# Patient Record
Sex: Female | Born: 1970 | Race: White | Hispanic: Yes | Marital: Married | Smoking: Never smoker
Health system: Southern US, Community
[De-identification: ages and names within clinical notes are randomized; demographics above are authoritative.]

## PROBLEM LIST (undated history)

## (undated) DIAGNOSIS — K759 Inflammatory liver disease, unspecified: Secondary | ICD-10-CM

## (undated) HISTORY — DX: Inflammatory liver disease, unspecified: K75.9

## (undated) HISTORY — PX: OTHER SURGICAL HISTORY: SHX169

## (undated) HISTORY — PX: BREAST SURGERY: SHX581

---

## 2017-03-08 ENCOUNTER — Encounter (HOSPITAL_BASED_OUTPATIENT_CLINIC_OR_DEPARTMENT_OTHER): Payer: Self-pay | Admitting: *Deleted

## 2017-03-08 ENCOUNTER — Emergency Department (HOSPITAL_BASED_OUTPATIENT_CLINIC_OR_DEPARTMENT_OTHER): Payer: 59

## 2017-03-08 ENCOUNTER — Emergency Department (HOSPITAL_BASED_OUTPATIENT_CLINIC_OR_DEPARTMENT_OTHER)
Admission: EM | Admit: 2017-03-08 | Discharge: 2017-03-08 | Disposition: A | Discharge status: Home / Self Care | Payer: 59 | Attending: Emergency Medicine | Admitting: Emergency Medicine

## 2017-03-08 ENCOUNTER — Other Ambulatory Visit: Payer: Self-pay

## 2017-03-08 DIAGNOSIS — K115 Sialolithiasis: Secondary | ICD-10-CM | POA: Diagnosis not present

## 2017-03-08 DIAGNOSIS — R221 Localized swelling, mass and lump, neck: Secondary | ICD-10-CM | POA: Diagnosis present

## 2017-03-08 LAB — CBC WITH DIFFERENTIAL/PLATELET
Basophils Absolute: 0 10*3/uL (ref 0.0–0.1)
Basophils Relative: 0 %
Eosinophils Absolute: 0 10*3/uL (ref 0.0–0.7)
Eosinophils Relative: 0 %
HCT: 38.4 % (ref 36.0–46.0)
Hemoglobin: 13 g/dL (ref 12.0–15.0)
Lymphocytes Relative: 39 %
Lymphs Abs: 1.9 10*3/uL (ref 0.7–4.0)
MCH: 29.9 pg (ref 26.0–34.0)
MCHC: 33.9 g/dL (ref 30.0–36.0)
MCV: 88.3 fL (ref 78.0–100.0)
Monocytes Absolute: 0.5 10*3/uL (ref 0.1–1.0)
Monocytes Relative: 11 %
Neutro Abs: 2.4 10*3/uL (ref 1.7–7.7)
Neutrophils Relative %: 50 %
Platelets: 227 10*3/uL (ref 150–400)
RBC: 4.35 MIL/uL (ref 3.87–5.11)
RDW: 12.5 % (ref 11.5–15.5)
WBC: 4.9 10*3/uL (ref 4.0–10.5)

## 2017-03-08 LAB — BASIC METABOLIC PANEL
Anion gap: 8 (ref 5–15)
BUN: 9 mg/dL (ref 6–20)
CO2: 28 mmol/L (ref 22–32)
Calcium: 8.5 mg/dL — ABNORMAL LOW (ref 8.9–10.3)
Chloride: 99 mmol/L — ABNORMAL LOW (ref 101–111)
Creatinine, Ser: 0.67 mg/dL (ref 0.44–1.00)
GFR calc Af Amer: >60 mL/min (ref >60)
GFR calc non Af Amer: >60 mL/min (ref >60)
Glucose, Bld: 94 mg/dL (ref 65–99)
Potassium: 3.3 mmol/L — ABNORMAL LOW (ref 3.5–5.1)
Sodium: 135 mmol/L (ref 135–145)

## 2017-03-08 MED ORDER — IOPAMIDOL (ISOVUE-300) INJECTION 61%
100.0000 mL | Freq: Once | INTRAVENOUS | Status: AC | PRN
  Administered 2017-03-08: 75 mL via INTRAVENOUS

## 2017-03-08 MED ORDER — AMOXICILLIN-POT CLAVULANATE 875-125 MG PO TABS: 1.0000 | ORAL_TABLET | Freq: Two times a day (BID) | ORAL | 0 refills | Status: DC

## 2017-03-08 NOTE — Discharge Instructions (Signed)
Begin eating sour foods such as lemons, oranges.  Eat lemon hard candies called Lemon Heads.  You can find these at the grocery store or drugstore in the candy section.  Please follow-up with the ear nose and throat doctor by calling on Monday morning.  If your neck becomes red, swollen, and warm, or you develop a fever again, begin taking the Augmentin prescription.  Do not begin taking this unless these things happen.

## 2017-03-08 NOTE — ED Triage Notes (Signed)
Cough and fever since 03-04-17. Urgent care gave her prednisone, benzonatate. She hasn't had any relief. Ibubrofen did relieve fever. Pt feels like swelling & itching in throat and moving to ear. Urgent care told her she needs a CT scan.

## 2017-03-08 NOTE — ED Provider Notes (Signed)
[facility] EMERGENCY DEPARTMENT Provider Note   CSN: [id number] Arrival date & time: 03/08/17  1228     History   Chief Complaint Chief Complaint  Patient presents with  . Cough  . Fever    HPI Cheryl Nichols is a 47 y.o. female who is previously healthy, originally from [place] and vaccination status is unknown, who presents with a left-sided neck mass.  Patient reports for the past 5 days she had a fever and cough.  She was seen in urgent care and prescribed Tessalon and prednisone.  She reports her fever and cough have resolved, however she developed a neck mass that has progressively grown overnight.  It is tender.  It hurts with swallowing.  Of note, she did have a root canal in November 2018, however has not had any significant problems with her teeth.  She has not taken any prednisone and Tessalon today.  HPI  History reviewed. No pertinent past medical history.  There are no active problems to display for this patient.   Past Surgical History:  Procedure Laterality Date  . BREAST SURGERY    . ceasarean      OB History    No data available       Home Medications    Prior to Admission medications   Medication Sig Start Date End Date Taking? Authorizing Provider  benzonatate (TESSALON) 100 MG capsule Take by mouth 3 (three) times daily.   Yes [provider]  predniSONE (DELTASONE) 20 MG tablet Take 20 mg by mouth daily with breakfast.   Yes [provider]  amoxicillin-clavulanate (AUGMENTIN) 875-125 MG tablet Take 1 tablet by mouth every 12 (twelve) hours. 03/08/17   Emi Holes, PA-C    Family History History reviewed. No pertinent family history.  Social History Social History   Tobacco Use  . Smoking status: Never Smoker  . Smokeless tobacco: Never Used  Substance Use Topics  . Alcohol use: No    Frequency: Never  . Drug use: No     Allergies   Patient has no known allergies.   Review of Systems Review of  Systems  Constitutional: Negative for chills and fever (resolved).  HENT: Negative for facial swelling and sore throat.   Respiratory: Positive for cough (resolved). Negative for shortness of breath.   Cardiovascular: Negative for chest pain.  Gastrointestinal: Negative for abdominal pain, nausea and vomiting.  Genitourinary: Negative for dysuria.  Musculoskeletal: Positive for neck pain (anterior, L sided mass, tender). Negative for back pain.  Skin: Negative for rash and wound.  Neurological: Negative for headaches.  Psychiatric/Behavioral: The patient is not nervous/anxious.      Physical Exam Updated Vital Signs BP 124/61 (BP Location: Left Arm)   Pulse 76   Temp 98.4 F (36.9 C) (Oral)   Resp 16   Ht 5' 6.54" (1.69 m)   Wt 68 kg (149 lb 14.6 oz)   LMP 02/12/2017 (Approximate)   SpO2 100%   BMI 23.81 kg/m   Physical Exam  Constitutional: She appears well-developed and well-nourished. No distress.  HENT:  Head: Normocephalic and atraumatic.  Mouth/Throat: Oropharynx is clear and moist. No oropharyngeal exudate.  Eyes: Conjunctivae are normal. Pupils are equal, round, and reactive to light. Right eye exhibits no discharge. Left eye exhibits no discharge. No scleral icterus.  Neck: Normal range of motion. Neck supple. No thyromegaly present.    Cardiovascular: Normal rate, regular rhythm, normal heart sounds and intact distal pulses. Exam reveals no gallop and  no friction rub.  No murmur heard. Pulmonary/Chest: Effort normal and breath sounds normal. No stridor. No respiratory distress. She has no wheezes. She has no rales.  Abdominal: Soft. Bowel sounds are normal. She exhibits no distension. There is no tenderness. There is no rebound and no guarding.  Musculoskeletal: She exhibits no edema.  Lymphadenopathy:    She has no cervical adenopathy.  Neurological: She is alert. Coordination normal.  Skin: Skin is warm and dry. No rash noted. She is not diaphoretic. No  pallor.  Psychiatric: She has a normal mood and affect.  Nursing note and vitals reviewed.    ED Treatments / Results  Labs (all labs ordered are listed, but only abnormal results are displayed) Labs Reviewed  BASIC METABOLIC PANEL - Abnormal; Notable for the following components:      Result Value   Potassium 3.3 (*)    Chloride 99 (*)    Calcium 8.5 (*)    All other components within normal limits  CBC WITH DIFFERENTIAL/PLATELET    EKG  EKG Interpretation None       Radiology Ct Soft Tissue Neck W Contrast  Result Date: 03/08/2017 CLINICAL DATA:  Left neck mass.  Recent fever. EXAM: CT NECK WITH CONTRAST TECHNIQUE: Multidetector CT imaging of the neck was performed using the standard protocol following the bolus administration of intravenous contrast. CONTRAST:  75mL ISOVUE-300 IOPAMIDOL (ISOVUE-300) INJECTION 61% COMPARISON:  None. FINDINGS: Pharynx and larynx: Symmetric pharyngeal soft tissues without mass. Left parapharyngeal space inflammation and fluid which is contiguous with submandibular inflammation described below. No retropharyngeal fluid. Unremarkable larynx. Salivary glands: Asymmetric enlargement of the left submandibular gland with associated edema and surrounding submandibular space inflammatory stranding and nonloculated fluid. Stranding extends into the anterior left upper neck and medially into the parapharyngeal space, and no abscess is identified. There is a punctate calcification in the far anterior aspect of the left submandibular gland, and there is also a punctate calcification medial to the gland which may be associated with a mildly dilated proximal duct (although it is somewhat more posteriorly positioned than is typical for a ductal stone and may be posterior to the duct and unrelated). There is no evidence of ductal dilatation. The right submandibular gland and both parotid glands are unremarkable. Thyroid: Unremarkable. Lymph nodes: A 12 mm left level III  lymph node and small left level II lymph nodes measuring up to 8 mm in short axis are likely reactive. Vascular: Major vascular structures of the neck are patent. Limited intracranial: Incidental mild cerebellar tonsillar ectopia estimated at 5 mm. Visualized orbits: Unremarkable. Mastoids and visualized paranasal sinuses: Mild right maxillary sinus mucosal thickening. No sinus fluid. Clear mastoid air cells. Skeleton: No acute osseous abnormality or suspicious osseous lesion. Upper chest: Minimal scarring in the lung apices. Other: None. IMPRESSION: Left submandibular gland sialadenitis with 1 or 2 associated punctate sialoliths. Electronically Signed   By: Sebastian AcheAllen  Grady M.D.   On: 03/08/2017 17:46    Procedures Procedures (including critical care time)  Medications Ordered in ED Medications  iopamidol (ISOVUE-300) 61 % injection 100 mL (75 mLs Intravenous Contrast Given 03/08/17 1705)     Initial Impression / Assessment and Plan / ED Course  I have reviewed the triage vital signs and the nursing notes.  Pertinent labs & imaging results that were available during my care of the patient were reviewed by me and considered in my medical decision making (see chart for details).     Patient with left submandibular gland  sialadenitis with 1 or 2 associated punctate sialoliths found on CT neck.  No sign of infection at this time.  Patient advised to start eating sour hard candies and sour foods.  Patient given Augmentin prescription and told only to fill if the area becomes red, warm, or she develops a fever again.  Follow-up to ENT.  Labs are unremarkable.  Return precautions discussed.  Patient understands and agrees with plan.  Patient vitals stable throughout ED course and discharged in satisfactory condition.  Final Clinical Impressions(s) / ED Diagnoses   Final diagnoses:  Salivary gland calculus    ED Discharge Orders        Ordered    amoxicillin-clavulanate (AUGMENTIN) 875-125 MG tablet   Every 12 hours     03/08/17 1811       Emi Holes, PA-C 03/09/17 1556    Tilden Fossa, MD 03/10/17 [phone]

## 2018-02-10 ENCOUNTER — Other Ambulatory Visit: Payer: Self-pay

## 2018-02-10 ENCOUNTER — Encounter: Payer: Self-pay | Admitting: Obstetrics and Gynecology

## 2018-02-10 ENCOUNTER — Ambulatory Visit (INDEPENDENT_AMBULATORY_CARE_PROVIDER_SITE_OTHER): Payer: 59 | Admitting: Obstetrics and Gynecology

## 2018-02-10 VITALS — BP 126/79 | HR 88 | Temp 97.7°F | Ht 65.0 in | Wt 154.5 lb

## 2018-02-10 DIAGNOSIS — Z1151 Encounter for screening for human papillomavirus (HPV): Secondary | ICD-10-CM | POA: Diagnosis not present

## 2018-02-10 DIAGNOSIS — Z01419 Encounter for gynecological examination (general) (routine) without abnormal findings: Secondary | ICD-10-CM | POA: Diagnosis not present

## 2018-02-10 DIAGNOSIS — Z124 Encounter for screening for malignant neoplasm of cervix: Secondary | ICD-10-CM | POA: Diagnosis not present

## 2018-02-10 NOTE — Progress Notes (Signed)
New GYN, presents for AEX/PAP.   Last PAP 01/2015;  Last Mammo 01/2015.  C/o pain 4/10 in left breast radiating to arm mostly before period.  LMP 01/25/18.

## 2018-02-10 NOTE — Progress Notes (Signed)
Subjective:     Cheryl Nichols is a 47 y.o. female P2 with BMI 25 and LMP 01/26/2018 who is here for a comprehensive physical exam. The patient reports no problems. She reports a monthly 6-day period. She is sexually active using natural family planning for contraception. Patient recently moved from [place] with mammogram records. Patient denies pelvic pain or abnormal discharge  No past medical history on file. Past Surgical History:  Procedure Laterality Date  . BREAST SURGERY    . ceasarean    . CESAREAN SECTION     History reviewed. No pertinent family history.  Social History   Socioeconomic History  . Marital status: Married    Spouse name: Not on file  . Number of children: Not on file  . Years of education: Not on file  . Highest education level: Not on file  Occupational History  . Not on file  Social Needs  . Financial resource strain: Not on file  . Food insecurity:    Worry: Not on file    Inability: Not on file  . Transportation needs:    Medical: Not on file    Non-medical: Not on file  Tobacco Use  . Smoking status: Never Smoker  . Smokeless tobacco: Never Used  Substance and Sexual Activity  . Alcohol use: No    Frequency: Never  . Drug use: No  . Sexual activity: Not on file  Lifestyle  . Physical activity:    Days per week: Not on file    Minutes per session: Not on file  . Stress: Not on file  Relationships  . Social connections:    Talks on phone: Not on file    Gets together: Not on file    Attends religious service: Not on file    Active member of club or organization: Not on file    Attends meetings of clubs or organizations: Not on file    Relationship status: Not on file  . Intimate partner violence:    Fear of current or ex partner: Not on file    Emotionally abused: Not on file    Physically abused: Not on file    Forced sexual activity: Not on file  Other Topics Concern  . Not on file  Social History Narrative  . Not on file    Health Maintenance  Topic Date Due  . PAP SMEAR  April 16, 1972  . HIV Screening  06/09/1987  . MAMMOGRAM  06/09/1990  . TETANUS/TDAP  06/09/1991  . INFLUENZA VACCINE  10/02/2017    Review of Systems Pertinent items are noted in HPI.   Objective:  Blood pressure 126/79, pulse 88, temperature 97.7 F (36.5 C), height 5\' 5"  (1.651 m), weight 154 lb 8 oz (70.1 kg), last menstrual period 01/26/2018.     GENERAL: Well-developed, well-nourished female in no acute distress.  HEENT: Normocephalic, atraumatic. Sclerae anicteric.  NECK: Supple. Normal thyroid.  LUNGS: Clear to auscultation bilaterally.  HEART: Regular rate and rhythm. BREASTS: Symmetric in size. No palpable masses or lymphadenopathy, skin changes, or nipple drainage. ABDOMEN: Soft, nontender, nondistended. No organomegaly. PELVIC: Normal external female genitalia. Vagina is pink and rugated.  Normal discharge. Normal appearing cervix. Uterus is normal in size. No adnexal mass or tenderness. EXTREMITIES: No cyanosis, clubbing, or edema, 2+ distal pulses.    Assessment:    Healthy female exam.      Plan:    pap smear collected Fasting labs ordered Screening mammogram ordered Patient will be contacted with abnormal  results See After Visit Summary for Counseling Recommendations

## 2018-02-11 LAB — CBC
Hematocrit: 37.4 % (ref 34.0–46.6)
Hemoglobin: 13.2 g/dL (ref 11.1–15.9)
MCH: 30.3 pg (ref 26.6–33.0)
MCHC: 35.3 g/dL (ref 31.5–35.7)
MCV: 86 fL (ref 79–97)
PLATELETS: 319 10*3/uL (ref 150–450)
RBC: 4.35 x10E6/uL (ref 3.77–5.28)
RDW: 12.4 % (ref 12.3–15.4)
WBC: 6.5 10*3/uL (ref 3.4–10.8)

## 2018-02-11 LAB — COMPREHENSIVE METABOLIC PANEL
ALBUMIN: 4.6 g/dL (ref 3.5–5.5)
ALT: 16 IU/L (ref 0–32)
AST: 16 IU/L (ref 0–40)
Albumin/Globulin Ratio: 1.6 (ref 1.2–2.2)
Alkaline Phosphatase: 102 IU/L (ref 39–117)
BUN / CREAT RATIO: 18 (ref 9–23)
BUN: 12 mg/dL (ref 6–24)
Bilirubin Total: 0.4 mg/dL (ref 0.0–1.2)
CHLORIDE: 102 mmol/L (ref 96–106)
CO2: 22 mmol/L (ref 20–29)
Calcium: 9.6 mg/dL (ref 8.7–10.2)
Creatinine, Ser: 0.68 mg/dL (ref 0.57–1.00)
GFR calc non Af Amer: 106 mL/min/{1.73_m2} (ref >59)
GFR, EST AFRICAN AMERICAN: 122 mL/min/{1.73_m2} (ref >59)
GLOBULIN, TOTAL: 2.8 g/dL (ref 1.5–4.5)
GLUCOSE: 92 mg/dL (ref 65–99)
Potassium: 4.2 mmol/L (ref 3.5–5.2)
SODIUM: 136 mmol/L (ref 134–144)
TOTAL PROTEIN: 7.4 g/dL (ref 6.0–8.5)

## 2018-02-11 LAB — LIPID PANEL
CHOL/HDL RATIO: 2 ratio (ref 0.0–4.4)
Cholesterol, Total: 164 mg/dL (ref 100–199)
HDL: 84 mg/dL (ref >39)
LDL CALC: 68 mg/dL (ref 0–99)
Triglycerides: 60 mg/dL (ref 0–149)
VLDL Cholesterol Cal: 12 mg/dL (ref 5–40)

## 2018-02-11 LAB — TSH: TSH: 1.28 u[IU]/mL (ref 0.450–4.500)

## 2018-02-11 LAB — HEMOGLOBIN A1C
ESTIMATED AVERAGE GLUCOSE: 105 mg/dL
HEMOGLOBIN A1C: 5.3 % (ref 4.8–5.6)

## 2018-02-13 LAB — CYTOLOGY - PAP
Diagnosis: NEGATIVE
HPV: NOT DETECTED

## 2018-03-24 ENCOUNTER — Ambulatory Visit
Admission: RE | Admit: 2018-03-24 | Discharge: 2018-03-24 | Disposition: A | Discharge status: Home / Self Care | Payer: 59 | Source: Ambulatory Visit | Attending: Obstetrics and Gynecology | Admitting: Obstetrics and Gynecology

## 2018-03-24 DIAGNOSIS — Z01419 Encounter for gynecological examination (general) (routine) without abnormal findings: Secondary | ICD-10-CM

## 2018-12-25 ENCOUNTER — Other Ambulatory Visit: Payer: Self-pay

## 2018-12-25 ENCOUNTER — Ambulatory Visit (INDEPENDENT_AMBULATORY_CARE_PROVIDER_SITE_OTHER): Payer: 59

## 2018-12-25 ENCOUNTER — Encounter: Payer: Self-pay | Admitting: Family Medicine

## 2018-12-25 ENCOUNTER — Ambulatory Visit (INDEPENDENT_AMBULATORY_CARE_PROVIDER_SITE_OTHER): Payer: 59 | Admitting: Family Medicine

## 2018-12-25 VITALS — BP 108/72 | HR 68 | Temp 97.9°F | Resp 12 | Ht 66.0 in | Wt 162.0 lb

## 2018-12-25 DIAGNOSIS — K824 Cholesterolosis of gallbladder: Secondary | ICD-10-CM | POA: Diagnosis not present

## 2018-12-25 DIAGNOSIS — M542 Cervicalgia: Secondary | ICD-10-CM

## 2018-12-25 DIAGNOSIS — M25511 Pain in right shoulder: Secondary | ICD-10-CM | POA: Diagnosis not present

## 2018-12-25 DIAGNOSIS — M25512 Pain in left shoulder: Secondary | ICD-10-CM

## 2018-12-25 MED ORDER — TIZANIDINE HCL 4 MG PO TABS: 4.0000 mg | ORAL_TABLET | Freq: Four times a day (QID) | ORAL | 0 refills | Status: DC | PRN

## 2018-12-25 MED ORDER — DICLOFENAC SODIUM 1 % TD GEL: 4.0000 g | Freq: Four times a day (QID) | TRANSDERMAL | 1 refills | Status: DC

## 2018-12-25 NOTE — Patient Instructions (Addendum)
A few things to remember from today's visit:   Cervicalgia - Plan: DG Cervical Spine Complete, Ambulatory referral to Physical Therapy, tiZANidine (ZANAFLEX) 4 MG tablet  Bilateral shoulder pain, unspecified chronicity - Plan: Ambulatory referral to Physical Therapy, diclofenac sodium (VOLTAREN) 1 % GEL  Polyp of gallbladder - Plan: US Abdomen Limited RUQ  Continue range of motion exercises.

## 2018-12-25 NOTE — Progress Notes (Signed)
HPI:   [name] is a 48 y.o. female, who is here today to establish care.  Former PCP: N/A She has a Holiday representative and follows regularly.  Last preventive routine visit: Gyn preventive in 02/2018.  Chronic medical problems: Otherwise healthy.  She moved to the area 3 years ago from [place]. C/O neck throbbing like pain for 4-5 months. It is intermittent and radiated to left shoulder and occipital area. She has tried local heat. Left shoulder limitation of ROM. At night it seems to be worse.  No hx of trauma or unusual activity.  Ibuprofen helps.  Instermitent, max 9/10. Exacerbated with movement.  Negative for associated fever,chills,numbness,tingling,or weakness.  Problem seems to be stable.  ROM has improved with ROM exercise.  She is also reporting Hx of gallbladder polyp, last [place] in 2017. She was supposed to have RUQ [place] annually. Denies abdominal pain,N/V,or changes in bowel habits.   Review of Systems  Constitutional: Negative for activity change, appetite change, fatigue, fever and unexpected weight change.  HENT: Negative for mouth sores, nosebleeds and trouble swallowing.   Eyes: Negative for redness and visual disturbance.  Respiratory: Negative for cough, shortness of breath and wheezing.   Cardiovascular: Negative for chest pain, palpitations and leg swelling.  Genitourinary: Negative for decreased urine volume and hematuria.  Musculoskeletal: Negative for gait problem and joint swelling.  Neurological: Negative for syncope, weakness and headaches.  Psychiatric/Behavioral: Positive for sleep disturbance. Negative for confusion.  Rest see pertinent positives and negatives per HPI.   No current outpatient medications on file prior to visit.   No current facility-administered medications on file prior to visit.    Past Medical History:  Diagnosis Date  . Hepatitis    No Known Allergies  Family History  Problem Relation Age of Onset  .  Breast cancer Neg Hx     Social History   Socioeconomic History  . Marital status: Married    Spouse name: Not on file  . Number of children: Not on file  . Years of education: Not on file  . Highest education level: Not on file  Occupational History  . Not on file  Social Needs  . Financial resource strain: Not on file  . Food insecurity    Worry: Not on file    Inability: Not on file  . Transportation needs    Medical: Not on file    Non-medical: Not on file  Tobacco Use  . Smoking status: Never Smoker  . Smokeless tobacco: Never Used  Substance and Sexual Activity  . Alcohol use: No    Frequency: Never  . Drug use: No  . Sexual activity: Not on file  Lifestyle  . Physical activity    Days per week: Not on file    Minutes per session: Not on file  . Stress: Not on file  Relationships  . Social Herbalist on phone: Not on file    Gets together: Not on file    Attends religious service: Not on file    Active member of club or organization: Not on file    Attends meetings of clubs or organizations: Not on file    Relationship status: Not on file  Other Topics Concern  . Not on file  Social History Narrative  . Not on file    Vitals:   12/25/18 1238  BP: 108/72  Pulse: 68  Resp: 12  Temp: 97.9 F (36.6 C)  SpO2: 98%  Body mass index is 26.15 kg/m.  Physical Exam  Nursing note and vitals reviewed. Constitutional: She is oriented to person, place, and time. She appears well-developed and well-nourished. No distress.  HENT:  Head: Normocephalic and atraumatic.  Mouth/Throat: Oropharynx is clear and moist and mucous membranes are normal.  Eyes: Pupils are equal, round, and reactive to light. Conjunctivae are normal.  Cardiovascular: Normal rate and regular rhythm.  No murmur heard. Pulses:      Dorsalis pedis pulses are 2+ on the right side and 2+ on the left side.  Respiratory: Effort normal and breath sounds normal. No respiratory  distress.  GI: Soft. She exhibits no mass. There is no hepatomegaly. There is no abdominal tenderness.  Musculoskeletal:        General: No edema.     Right shoulder: She exhibits decreased range of motion. She exhibits no tenderness.     Left shoulder: She exhibits decreased range of motion. She exhibits no tenderness.     Comments: Bilateral shoulder: No deformity, edema, or erythema appreciated.No muscle atrophy. Cheryl Nichols' test pos, drop arm rotator cuff test pos left, empty can supraspinatus test pos left, cross body, lift-Off Subscapularis test pos bilateral. ROM limited,bilateral, L>R.  Lymphadenopathy:    She has no cervical adenopathy.  Neurological: She is alert and oriented to person, place, and time. She has normal strength. No cranial nerve deficit. Gait normal.  Skin: Skin is warm. No rash noted. No erythema.  Psychiatric: She has a normal mood and affect.  Well groomed, good eye contact.   ASSESSMENT AND PLAN:  Ms. Cheryl Nichols was seen today for establish care.  Diagnoses and all orders for this visit:  Cervicalgia Because persistent new onset cervical pain imaging will be obtained today. ROM exercises. PT referral placed. Local massage and ice/heat. Side effects of Zanaflex discussed. Instructed abut warning signs.  -     DG Cervical Spine Complete; Future -     Ambulatory referral to Physical Therapy -     tiZANidine (ZANAFLEX) 4 MG tablet; Take 1 tablet (4 mg total) by mouth every 6 (six) hours as needed for muscle spasms. -     DG Cervical Spine Complete  Bilateral shoulder pain, unspecified chronicity Impingement syn L>R. PT referral placed. Topical Diclofenac qid.   -     Ambulatory referral to Physical Therapy -     diclofenac sodium (VOLTAREN) 1 % GEL; Apply 4 g topically 4 (four) times daily.  Polyp of gallbladder Asymptomatic. RUQ [place] will be arranged.  -     US Abdomen Limited RUQ; Future    Return in about 1 year (around 12/25/2019), or if  symptoms worsen or fail to improve, for cpe.     Cheryl Nichols G. [place], MD  [facility]. [place] office.

## 2018-12-27 ENCOUNTER — Encounter: Payer: Self-pay | Admitting: Family Medicine

## 2019-01-04 ENCOUNTER — Ambulatory Visit
Admission: RE | Admit: 2019-01-04 | Discharge: 2019-01-04 | Disposition: A | Discharge status: Home / Self Care | Payer: 59 | Source: Ambulatory Visit | Attending: Family Medicine | Admitting: Family Medicine

## 2019-01-04 DIAGNOSIS — K824 Cholesterolosis of gallbladder: Secondary | ICD-10-CM

## 2019-01-06 ENCOUNTER — Encounter: Payer: Self-pay | Admitting: Physical Therapy

## 2019-01-06 ENCOUNTER — Ambulatory Visit: Payer: 59 | Attending: Family Medicine | Admitting: Physical Therapy

## 2019-01-06 ENCOUNTER — Encounter: Payer: Self-pay | Admitting: Family Medicine

## 2019-01-06 ENCOUNTER — Other Ambulatory Visit: Payer: Self-pay

## 2019-01-06 DIAGNOSIS — M25511 Pain in right shoulder: Secondary | ICD-10-CM

## 2019-01-06 DIAGNOSIS — M25512 Pain in left shoulder: Secondary | ICD-10-CM | POA: Diagnosis present

## 2019-01-06 DIAGNOSIS — M25612 Stiffness of left shoulder, not elsewhere classified: Secondary | ICD-10-CM | POA: Diagnosis present

## 2019-01-06 DIAGNOSIS — M25611 Stiffness of right shoulder, not elsewhere classified: Secondary | ICD-10-CM

## 2019-01-06 DIAGNOSIS — M62838 Other muscle spasm: Secondary | ICD-10-CM | POA: Diagnosis not present

## 2019-01-06 DIAGNOSIS — M542 Cervicalgia: Secondary | ICD-10-CM | POA: Diagnosis present

## 2019-01-06 NOTE — Patient Instructions (Addendum)
Trigger Point Dry Needling  . What is Trigger Point Dry Needling (DN)? o DN is a physical therapy technique used to treat muscle pain and dysfunction. Specifically, DN helps deactivate muscle trigger points (muscle knots).  o A thin filiform needle is used to penetrate the skin and stimulate the underlying trigger point. The goal is for a local twitch response (LTR) to occur and for the trigger point to relax. No medication of any kind is injected during the procedure.   . What Does Trigger Point Dry Needling Feel Like?  o The procedure feels different for each individual patient. Some patients report that they do not actually feel the needle enter the skin and overall the process is not painful. Very mild bleeding may occur. However, many patients feel a deep cramping in the muscle in which the needle was inserted. This is the local twitch response.   Marland Kitchen How Will I feel after the treatment? o Soreness is normal, and the onset of soreness may not occur for a few hours. Typically this soreness does not last longer than two days.  o Bruising is uncommon, however; ice can be used to decrease any possible bruising.  o In rare cases feeling tired or nauseous after the treatment is normal. In addition, your symptoms may get worse before they get better, this period will typically not last longer than 24 hours.   . What Can I do After My Treatment? o Increase your hydration by drinking more water for the next 24 hours. o You may place ice or heat on the areas treated that have become sore, however, do not use heat on inflamed or bruised areas. Heat often brings more relief post needling. o You can continue your regular activities, but vigorous activity is not recommended initially after the treatment for 24 hours. o DN is best combined with other physical therapy such as strengthening, stretching, and other therapies.    [place] [address], [place], [place]  [address] Phone # [phone] Fax [phone]  Access Code: [facility]  URL: [email or web address]  Date: 01/06/2019  Prepared by: Jari Favre   Exercises  Seated Shoulder Flexion AAROM with Pulley Behind - 10 reps - 1 sets - 5 sec hold - 2x daily - 7x weekly  Single Arm Doorway Pec Stretch at 60 Elevation - 10 reps - 1 sets - 5 sec hold - 2x daily - 7x weekly  Standing Shoulder Internal Rotation Stretch with Towel - 10 reps - 1 sets - 5 sec hold - 2x daily - 7x weekly  Circular Shoulder Pendulum with Table Support - 3 reps - 1 sets - as needed hold - 1x daily - 7x weekly

## 2019-01-06 NOTE — Therapy (Signed)
[facility] [address], [place], [place], [address] Phone: [phone]   Fax:  [phone]  Physical Therapy Evaluation  Patient Details  Name: Cheryl Nichols MRN: [id number] Date of Birth: 04-08-1970 Referring Provider (PT): Martinique, Betty G, MD   Encounter Date: 01/06/2019  PT End of Session - 01/06/19 1558    Visit Number  1    Date for PT Re-Evaluation  03/03/19    PT Start Time  3474    PT Stop Time  1530    PT Time Calculation (min)  45 min    Activity Tolerance  Patient tolerated treatment well    Behavior During Therapy  [facility] for tasks assessed/performed       Past Medical History:  Diagnosis Date  . Hepatitis     Past Surgical History:  Procedure Laterality Date  . BREAST SURGERY    . ceasarean    . CESAREAN SECTION      There were no vitals filed for this visit.   Subjective Assessment - 01/06/19 1446    Subjective  Pt states she has had pain since about July of this.  Moving her arms in certain directions increases pain.  States the doctor perscribed votairin gel.    Pertinent History  C5/6 disc narrowing on recent radiograph    Limitations  Lifting    Diagnostic tests  x-ray    Currently in Pain?  Yes    Pain Score  9     Pain Location  Neck    Pain Orientation  Right;Left    Pain Descriptors / Indicators  Aching;Sharp    Pain Type  Chronic pain    Pain Radiating Towards  bilat arms down to wrists and rarely into pinky finger    Pain Onset  More than a month ago    Pain Frequency  Intermittent    Aggravating Factors   moving arms back and overhead    Pain Relieving Factors  hot shower    Effect of Pain on Daily Activities  reach behind back    Multiple Pain Sites  No         [facility] PT Assessment - 01/06/19 0001      Assessment   Medical Diagnosis  M54.2 (ICD-10-CM) - Cervicalgia; M25.511,M25.512 (ICD-10-CM) - Bilateral shoulder pain, unspecified chronicity    Referring Provider  (PT)  Martinique, Betty G, MD    Onset Date/Surgical Date  --   July   Hand Dominance  Right    Prior Therapy  No      Precautions   Precautions  None      Restrictions   Weight Bearing Restrictions  No      Balance Screen   Has the patient fallen in the past 6 months  No      [place] residence    Living Arrangements  Spouse/significant other;Children   2 children     Prior Function   Level of Independence  Independent    Vocation  Unemployed    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      Cognition   Overall Cognitive Status  Within Functional Limits for tasks assessed      Observation/Other Assessments   Focus on Therapeutic Outcomes (FOTO)   40% limited   goal 28% limited     Posture/Postural Control   Posture/Postural Control  Postural limitations    Postural Limitations  Increased thoracic kyphosis  ROM / Strength   AROM / PROM / Strength  AROM;Strength;PROM      AROM   AROM Assessment Site  Shoulder;Cervical    Right/Left Shoulder  Right;Left    Right Shoulder Flexion  150 Degrees    Left Shoulder Flexion  145 Degrees    Cervical Flexion  70    Cervical Extension  48    Cervical - Right Side Bend  32    Cervical - Left Side Bend  35    Cervical - Right Rotation  60    Cervical - Left Rotation  60      PROM   Overall PROM   Deficits    Overall PROM Comments  Lt shoulder ER; Rt shoulder    PROM Assessment Site  Shoulder    Right/Left Shoulder  Right;Left    Right Shoulder Flexion  --   limited   Right Shoulder ABduction  --   limited   Right Shoulder External Rotation  --   limited 50%   Left Shoulder Flexion  --   limited   Left Shoulder ABduction  --   limited   Left Shoulder External Rotation  --   limited 80%     Strength   Overall Strength Comments  cervical left rotation painful; cervical and shoulder grossly 5/5 except bilateral flexion and abduction 4/5 +pain      Palpation   Palpation comment  Tight  Lt>Rt pectoralis, upper traps and rhomboids      Special Tests    Special Tests  Cervical;Rotator Cuff Impingement    Cervical Tests  Spurling's;Dictraction    Rotator Cuff Impingment tests  Neer impingement test;Hawkins- Kennedy test      Spurling's   Findings  Positive    Side  Right    Comment  pain in cervical - no radiating sx      Distraction Test   Findngs  Negative      Neer Impingement test    Findings  Negative      Hawkins-Kennedy test   Comments  inconclusive - ER was more painful that IR                Objective measurements completed on examination: See above findings.      [facility] Adult PT Treatment/Exercise - 01/06/19 0001      Self-Care   Self-Care  Other Self-Care Comments    Other Self-Care Comments   intial HEP educated and performed             PT Education - 01/06/19 1558    Education Details  Access Code: [email or web address]    Person(s) Educated  Patient    Methods  Explanation;Demonstration;Verbal cues;Handout    Comprehension  Verbalized understanding;Returned demonstration       PT Short Term Goals - 01/06/19 1701      PT SHORT TERM GOAL #1   Title  ind with initial HEP    Time  4    Period  Weeks    Status  New    Target Date  02/03/19      PT SHORT TERM GOAL #2   Title  pt will demonstrate shoulder flexion of at least 160 deg bilaterally for improved overhead reaching    Time  4    Period  Weeks    Status  New    Target Date  02/03/19      PT SHORT TERM GOAL #3   Title  Pt will report 7/10  pain at most    Baseline  9/10    Time  4    Period  Weeks    Status  New    Target Date  02/03/19        PT Long Term Goals - 01/06/19 1704      PT LONG TERM GOAL #1   Title  ind with advanced HEP    Time  8    Period  Weeks    Status  New    Target Date  03/03/19      PT LONG TERM GOAL #2   Title  FOTO <or = to 28% limited    Time  8    Period  Weeks    Target Date  03/03/19      PT LONG TERM GOAL #3   Title  Pt  will be able to put her coat on without increased pain    Baseline  difficult and has pain putting on coat    Time  8    Period  Weeks    Status  New    Target Date  03/03/19      PT LONG TERM GOAL #4   Title  Pt will be able to reach overhead in cabinets without using a step stool due to return of AROM bilateral UE.    Baseline  unable to reach cabinet without step    Time  8    Period  Weeks    Status  New    Target Date  03/03/19      PT LONG TERM GOAL #5   Title  Pt will report at least 60% reduction in pain overall during typical daily activities    Time  8    Period  Weeks    Status  New    Target Date  03/03/19             Plan - 01/06/19 1559    Clinical Impression Statement  Pt presents to clinic with bilateral shoulder pain Lt>Rt that began on the left side several months ago.  Pt states pain comes from the neck and down  into the shoulders.  Pt did not have radiating pain with any cervical movements.  She was positive for Spurlings on tht Rt side with neck pain.  pt has limited ROM bilateral shoulders and pain with movement.  Pt is limited with shoulder ER Lt>Rt and appears to have a capsular pattern of tension that PT suspects may be adhesive capsulitis.  She has muscle spasms as mentioned above.  Pt has some shoulder weakness also as mentioned above.  She has decreased thoracic spine.  Pt will benefit from skilled PT to address these impairments so she can regain function and quality of life.    Personal Factors and Comorbidities  Comorbidity 1    Comorbidities  multiple body regions included    Examination-Activity Limitations  Dressing;Reach Overhead;Bathing    Examination-Participation Restrictions  Cleaning    Stability/Clinical Decision Making  Evolving/Moderate complexity    Clinical Decision Making  Moderate    Rehab Potential  Good    PT Frequency  2x / week    PT Duration  8 weeks    PT Treatment/Interventions  ADLs/Self Care Home  Management;Biofeedback;Cryotherapy;Electrical Stimulation;Iontophoresis /ml Dexamethasone;Moist Heat;Traction;Neuromuscular re-education;Therapeutic exercise;Therapeutic activities;Patient/family education;Manual techniques;Taping;Dry needling;Passive range of motion    PT Next Visit Plan  pulleys, AAROM, UE ranger, try DN to cervical paraspinals and upper traps - discussed and gave info  at eval    PT Home Exercise Plan  Access Code: [facility]    Consulted and Agree with Plan of Care  Patient       Patient will benefit from skilled therapeutic intervention in order to improve the following deficits and impairments:  Pain, Postural dysfunction, Increased muscle spasms, Impaired flexibility, Decreased range of motion, Impaired UE functional use, Increased fascial restricitons, Hypomobility  Visit Diagnosis: Other muscle spasm  Cervicalgia  Acute pain of right shoulder  Acute pain of left shoulder  Stiffness of left shoulder, not elsewhere classified  Stiffness of right shoulder, not elsewhere classified     Problem List There are no active problems to display for this patient.   Junious Silk, PT 01/06/2019, 5:26 PM  [facility] [address] [place], [place], [address] Phone: [phone]   Fax:  [phone]  Name: Cheryl Nichols MRN: [id number] Date of Birth: 10-07-1970

## 2019-01-11 ENCOUNTER — Other Ambulatory Visit: Payer: Self-pay

## 2019-01-11 ENCOUNTER — Ambulatory Visit: Payer: 59 | Admitting: Physical Therapy

## 2019-01-11 DIAGNOSIS — M25512 Pain in left shoulder: Secondary | ICD-10-CM

## 2019-01-11 DIAGNOSIS — M25612 Stiffness of left shoulder, not elsewhere classified: Secondary | ICD-10-CM

## 2019-01-11 DIAGNOSIS — M25611 Stiffness of right shoulder, not elsewhere classified: Secondary | ICD-10-CM

## 2019-01-11 DIAGNOSIS — M542 Cervicalgia: Secondary | ICD-10-CM

## 2019-01-11 DIAGNOSIS — M62838 Other muscle spasm: Secondary | ICD-10-CM

## 2019-01-11 DIAGNOSIS — M25511 Pain in right shoulder: Secondary | ICD-10-CM

## 2019-01-11 NOTE — Therapy (Signed)
[facility] [address], [place], [place], [address] Phone: [phone]   Fax:  [phone]  Physical Therapy Treatment  Patient Details  Name: Cheryl Nichols MRN: [id number] Date of Birth: September 20, 1970 Referring Provider (PT): Martinique, Betty G, MD   Encounter Date: 01/11/2019  PT End of Session - 01/11/19 1454    Visit Number  2    Date for PT Re-Evaluation  03/03/19    PT Start Time  1448    PT Stop Time  1528    PT Time Calculation (min)  40 min    Activity Tolerance  Patient tolerated treatment well    Behavior During Therapy  [place] for tasks assessed/performed       Past Medical History:  Diagnosis Date  . Hepatitis     Past Surgical History:  Procedure Laterality Date  . BREAST SURGERY    . ceasarean    . CESAREAN SECTION      There were no vitals filed for this visit.  Subjective Assessment - 01/11/19 1451    Subjective  Pt states she did the exercises 2x since eval and feels a little sore after, but then it eases off.  No changes in pain yet.    Pertinent History  C5/6 disc narrowing on recent radiograph    Limitations  Lifting    Currently in Pain?  No/denies                       [facility] Adult PT Treatment/Exercise - 01/11/19 0001      Exercises   Exercises  Shoulder      Shoulder Exercises: Standing   Flexion Limitations  finger ladder - abduction - 5 x each side      Shoulder Exercises: Pulleys   Flexion  3 minutes      Shoulder Exercises: Stretch   Other Shoulder Stretches  upper trap stretch - 10 sec x 3 bilateral    Other Shoulder Stretches  thoracic rotation stretch - 10 sec x 3      Manual Therapy   Manual Therapy  Soft tissue mobilization;Myofascial release    Soft tissue mobilization  rhomboids, upper traps, suboccipitals, cervical paraspinals, thoracic paraspinals    Myofascial Release  cervical paraspinals               PT Short Term Goals - 01/11/19  1500      PT SHORT TERM GOAL #1   Title  ind with initial HEP    Status  On-going      PT SHORT TERM GOAL #2   Title  pt will demonstrate shoulder flexion of at least 160 deg bilaterally for improved overhead reaching    Status  On-going      PT SHORT TERM GOAL #3   Title  Pt will report 7/10 pain at most    Status  On-going        PT Long Term Goals - 01/06/19 1704      PT LONG TERM GOAL #1   Title  ind with advanced HEP    Time  8    Period  Weeks    Status  New    Target Date  03/03/19      PT LONG TERM GOAL #2   Title  FOTO <or = to 28% limited    Time  8    Period  Weeks    Target Date  03/03/19  PT LONG TERM GOAL #3   Title  Pt will be able to put her coat on without increased pain    Baseline  difficult and has pain putting on coat    Time  8    Period  Weeks    Status  New    Target Date  03/03/19      PT LONG TERM GOAL #4   Title  Pt will be able to reach overhead in cabinets without using a step stool due to return of AROM bilateral UE.    Baseline  unable to reach cabinet without step    Time  8    Period  Weeks    Status  New    Target Date  03/03/19      PT LONG TERM GOAL #5   Title  Pt will report at least 60% reduction in pain overall during typical daily activities    Time  8    Period  Weeks    Status  New    Target Date  03/03/19            Plan - 01/11/19 1723    Clinical Impression Statement  Today's session focused on AAROM and improved soft tissue length and pliability.  Pt has very tight upper traps, thoracic paraspinals at T2-4, rhomboids, cervical paraspinals on the Rt.  Pt had reduced symptoms after treatment.  Rt scapular winging was also noted during STM with patient in prone position.  Pt will benefit from skilled PT to continue to work on periscapular strength and coordination as well as release of muscle tension througout neck and upper back for return to full activities.    PT Treatment/Interventions  ADLs/Self Care  Home Management;Biofeedback;Cryotherapy;Electrical Stimulation;Iontophoresis 4mg /ml Dexamethasone;Moist Heat;Traction;Neuromuscular re-education;Therapeutic exercise;Therapeutic activities;Patient/family education;Manual techniques;Taping;Dry needling;Passive range of motion    PT Next Visit Plan  pulleys, f/u new stretches added to HEP, STM and DN to cervical paraspinals, upper traps and thoracic paraspinals    PT Home Exercise Plan  Access Code:    Recommended Other Services  cert signed    Consulted and Agree with Plan of Care  Patient       Patient will benefit from skilled therapeutic intervention in order to improve the following deficits and impairments:  Pain, Postural dysfunction, Increased muscle spasms, Impaired flexibility, Decreased range of motion, Impaired UE functional use, Increased fascial restricitons, Hypomobility  Visit Diagnosis: Other muscle spasm  Cervicalgia  Acute pain of right shoulder  Acute pain of left shoulder  Stiffness of left shoulder, not elsewhere classified  Stiffness of right shoulder, not elsewhere classified     Problem List There are no active problems to display for this patient.   [email or web address], PT 01/11/2019, 5:29 PM  [facility] [address], [address] [place], [place], [place] Phone: [phone]   Fax:  [phone]  Name: Jonia Oakey MRN: Migdalia Dk Date of Birth: [birth year]

## 2019-01-14 ENCOUNTER — Other Ambulatory Visit: Payer: Self-pay

## 2019-01-14 ENCOUNTER — Ambulatory Visit: Payer: 59 | Admitting: Physical Therapy

## 2019-01-14 ENCOUNTER — Encounter: Payer: Self-pay | Admitting: Physical Therapy

## 2019-01-14 DIAGNOSIS — M542 Cervicalgia: Secondary | ICD-10-CM

## 2019-01-14 DIAGNOSIS — M62838 Other muscle spasm: Secondary | ICD-10-CM

## 2019-01-14 DIAGNOSIS — M25611 Stiffness of right shoulder, not elsewhere classified: Secondary | ICD-10-CM

## 2019-01-14 DIAGNOSIS — M25612 Stiffness of left shoulder, not elsewhere classified: Secondary | ICD-10-CM

## 2019-01-14 DIAGNOSIS — M25511 Pain in right shoulder: Secondary | ICD-10-CM

## 2019-01-14 DIAGNOSIS — M25512 Pain in left shoulder: Secondary | ICD-10-CM

## 2019-01-14 NOTE — Therapy (Signed)
[facility] [address], [address] [place], [place], [id number] Phone: [phone]   Fax:  [phone]  Physical Therapy Treatment  Patient Details  Name: Cheryl Nichols MRN: [id number] Date of Birth: 04/19/1970 Referring Provider (PT): [place], Betty G, MD   Encounter Date: 01/14/2019  PT End of Session - 01/14/19 1656    Visit Number  3    Date for PT Re-Evaluation  03/03/19    PT Start Time  1615    PT Stop Time  1655    PT Time Calculation (min)  40 min    Activity Tolerance  Patient tolerated treatment well;No increased pain    Behavior During Therapy  [facility] for tasks assessed/performed       Past Medical History:  Diagnosis Date  . Hepatitis     Past Surgical History:  Procedure Laterality Date  . BREAST SURGERY    . ceasarean    . CESAREAN SECTION      There were no vitals filed for this visit.  Subjective Assessment - 01/14/19 1622    Subjective  Pt states her HEP is going well. No pain currently.    Pertinent History  C5/6 disc narrowing on recent radiograph    Limitations  Lifting    Currently in Pain?  No/denies                       [facility] Adult PT Treatment/Exercise - 01/14/19 0001      Shoulder Exercises: Supine   Other Supine Exercises  snow angel stretch over foam roll x15 reps     Other Supine Exercises  thoracic extension over soft foam roll x10 reps       Shoulder Exercises: Seated   Horizontal ABduction  Strengthening;Both;12 reps    Theraband Level (Shoulder Horizontal ABduction)  Level 1 (Yellow)      Shoulder Exercises: Sidelying   Other Sidelying Exercises  thoracic rotation open book stretch x10 reps each direction, yellow TB added for resistance x10 reps each direction      Shoulder Exercises: Standing   Flexion Limitations  finger ladder scaption 5x5 sec hold each side      Shoulder Exercises: Stretch   Cross Chest Stretch  5 reps;10 seconds    Cross Chest  Stretch Limitations  Rt and Lt       Manual Therapy   Manual Therapy  Joint mobilization    Joint Mobilization  Grade III PA shoulder mobilization    Soft tissue mobilization  Rt and Lt rhomboids, levator scap    Myofascial Release  levator scap Lt and Rt             PT Education - 01/14/19 1656    Education Details  technique with therex; heat prior to HEP to help loosen up the shoulder    Person(s) Educated  Patient    Methods  Explanation    Comprehension  Verbalized understanding       PT Short Term Goals - 01/11/19 1500      PT SHORT TERM GOAL #1   Title  ind with initial HEP    Status  On-going      PT SHORT TERM GOAL #2   Title  pt will demonstrate shoulder flexion of at least 160 deg bilaterally for improved overhead reaching    Status  On-going      PT SHORT TERM GOAL #3   Title  Pt will report 7/10 pain at  most    Status  On-going        PT Long Term Goals - 01/06/19 1704      PT LONG TERM GOAL #1   Title  ind with advanced HEP    Time  8    Period  Weeks    Status  New    Target Date  03/03/19      PT LONG TERM GOAL #2   Title  FOTO <or = to 28% limited    Time  8    Period  Weeks    Target Date  03/03/19      PT LONG TERM GOAL #3   Title  Pt will be able to put her coat on without increased pain    Baseline  difficult and has pain putting on coat    Time  8    Period  Weeks    Status  New    Target Date  03/03/19      PT LONG TERM GOAL #4   Title  Pt will be able to reach overhead in cabinets without using a step stool due to return of AROM bilateral UE.    Baseline  unable to reach cabinet without step    Time  8    Period  Weeks    Status  New    Target Date  03/03/19      PT LONG TERM GOAL #5   Title  Pt will report at least 60% reduction in pain overall during typical daily activities    Time  8    Period  Weeks    Status  New    Target Date  03/03/19            Plan - 01/14/19 1656    Clinical Impression Statement   Pt feels her Lt shoulder ROM is improving, and she is now able to reach behind her back to the sacrum with her Lt UE. Session focused on therex to increase thoracic and shoulder mobility. Pt required some adjustments with several exercises to resolve shoulder pain. Pt has glenohumeral joint hypomobility as well as multiple trigger points throughout the scapular/shoulder region. PT completed manual trigger point release and STM to the area to decrease tension. No pain was reported end of today's session.    PT Treatment/Interventions  ADLs/Self Care Home Management;Biofeedback;Cryotherapy;Electrical Stimulation;Iontophoresis 4mg /ml Dexamethasone;Moist Heat;Traction;Neuromuscular re-education;Therapeutic exercise;Therapeutic activities;Patient/family education;Manual techniques;Taping;Dry needling;Passive range of motion    PT Next Visit Plan  pulleys, STM and DN to cervical paraspinals, upper traps and thoracic paraspinals    PT Home Exercise Plan  Access Code: [email or web address]    Consulted and Agree with Plan of Care  Patient       Patient will benefit from skilled therapeutic intervention in order to improve the following deficits and impairments:  Pain, Postural dysfunction, Increased muscle spasms, Impaired flexibility, Decreased range of motion, Impaired UE functional use, Increased fascial restricitons, Hypomobility  Visit Diagnosis: Other muscle spasm  Cervicalgia  Acute pain of right shoulder  Acute pain of left shoulder  Stiffness of left shoulder, not elsewhere classified  Stiffness of right shoulder, not elsewhere classified     Problem List There are no active problems to display for this patient.   5:00 PM,01/14/19 13/12/20 PT, DPT [facility]  [phone]  [facility] [address] [place], [place], [place] Phone: [phone]   Fax:  [phone]  Name: Cheryl  Nichols MRN: [id number] Date of Birth: [birth year]

## 2019-01-18 ENCOUNTER — Ambulatory Visit: Payer: 59 | Admitting: Physical Therapy

## 2019-01-18 ENCOUNTER — Encounter: Payer: Self-pay | Admitting: Physical Therapy

## 2019-01-18 ENCOUNTER — Other Ambulatory Visit: Payer: Self-pay

## 2019-01-18 DIAGNOSIS — M25511 Pain in right shoulder: Secondary | ICD-10-CM

## 2019-01-18 DIAGNOSIS — M25512 Pain in left shoulder: Secondary | ICD-10-CM

## 2019-01-18 DIAGNOSIS — M542 Cervicalgia: Secondary | ICD-10-CM

## 2019-01-18 DIAGNOSIS — M25612 Stiffness of left shoulder, not elsewhere classified: Secondary | ICD-10-CM

## 2019-01-18 DIAGNOSIS — M62838 Other muscle spasm: Secondary | ICD-10-CM | POA: Diagnosis not present

## 2019-01-18 DIAGNOSIS — M25611 Stiffness of right shoulder, not elsewhere classified: Secondary | ICD-10-CM

## 2019-01-18 NOTE — Therapy (Signed)
[facility] [address] [place], [place], [address] Phone: [phone]   Fax:  [phone]  Physical Therapy Treatment  Patient Details  Name: Cheryl Nichols MRN: [id number] Date of Birth: 1970/03/18 Referring Provider (PT): [place], Betty G, MD   Encounter Date: 01/18/2019  PT End of Session - 01/18/19 1536    Visit Number  4    Date for PT Re-Evaluation  03/03/19    PT Start Time  1533    PT Stop Time  1613    PT Time Calculation (min)  40 min    Activity Tolerance  Patient tolerated treatment well;No increased pain    Behavior During Therapy  WFL for tasks assessed/performed       Past Medical History:  Diagnosis Date  . Hepatitis     Past Surgical History:  Procedure Laterality Date  . BREAST SURGERY    . ceasarean    . CESAREAN SECTION      There were no vitals filed for this visit.  Subjective Assessment - 01/18/19 1535    Subjective  Pt repots pain in the shoulders is better.  States the Rt shoulder is still very stiff.  The left arm is better than the right.    Pertinent History  C5/6 disc narrowing on recent radiograph    Limitations  Lifting    Currently in Pain?  No/denies                       [facility] Adult PT Treatment/Exercise - 01/18/19 0001      Shoulder Exercises: Supine   Horizontal ABduction  Strengthening;Both;20 reps;Theraband    Theraband Level (Shoulder Horizontal ABduction)  Level 1 (Yellow)    External Rotation  Strengthening;Both;20 reps;Theraband    Theraband Level (Shoulder External Rotation)  Level 1 (Yellow)    Diagonals  Strengthening;Both;10 reps;Theraband    Theraband Level (Shoulder Diagonals)  Level 1 (Yellow)    Other Supine Exercises  shoulder extension - PT holding band overhead - 20x yellow      Shoulder Exercises: Sidelying   Other Sidelying Exercises  thoracic rotation open book stretch x10 reps each direction, yellow TB added for resistance  x10 reps each direction      Shoulder Exercises: ROM/Strengthening   Other ROM/Strengthening Exercises  sleeper stretch - 5 x 5 sec hold      Shoulder Exercises: Stretch   Other Shoulder Stretches  IR stretch with pulleys - 10x 10 sec hold      Manual Therapy   Joint Mobilization  grade III PA T2-4    Soft tissue mobilization  Rt and Lt rhomboids, levator scap    Myofascial Release  thoracic paraspinals, levator Rt and Lt               PT Short Term Goals - 01/11/19 1500      PT SHORT TERM GOAL #1   Title  ind with initial HEP    Status  On-going      PT SHORT TERM GOAL #2   Title  pt will demonstrate shoulder flexion of at least 160 deg bilaterally for improved overhead reaching    Status  On-going      PT SHORT TERM GOAL #3   Title  Pt will report 7/10 pain at most    Status  On-going        PT Long Term Goals - 01/06/19 1704      PT LONG TERM  GOAL #1   Title  ind with advanced HEP    Time  8    Period  Weeks    Status  New    Target Date  03/03/19      PT LONG TERM GOAL #2   Title  FOTO <or = to 28% limited    Time  8    Period  Weeks    Target Date  03/03/19      PT LONG TERM GOAL #3   Title  Pt will be able to put her coat on without increased pain    Baseline  difficult and has pain putting on coat    Time  8    Period  Weeks    Status  New    Target Date  03/03/19      PT LONG TERM GOAL #4   Title  Pt will be able to reach overhead in cabinets without using a step stool due to return of AROM bilateral UE.    Baseline  unable to reach cabinet without step    Time  8    Period  Weeks    Status  New    Target Date  03/03/19      PT LONG TERM GOAL #5   Title  Pt will report at least 60% reduction in pain overall during typical daily activities    Time  8    Period  Weeks    Status  New    Target Date  03/03/19            Plan - 01/18/19 1614    Clinical Impression Statement  Pt was monitored for pain but encouraged to stretch to  maximum ROM as tolerated.  Pt states functional improvements have been noticed with Lt UE which was the side that started worse side.  Pt has shoulder IR limited Lt>Rt.  She did well with addition of band exercises in supine.  Pt was educated in Radio broadcast assistant and added to HEP.  Pt will benefit from skilled PT to continue to work towards functional goals.    PT Treatment/Interventions  ADLs/Self Care Home Management;Biofeedback;Cryotherapy;Electrical Stimulation;Iontophoresis 4mg /ml Dexamethasone;Moist Heat;Traction;Neuromuscular re-education;Therapeutic exercise;Therapeutic activities;Patient/family education;Manual techniques;Taping;Dry needling;Passive range of motion    PT Next Visit Plan  pulleys, STM for muscle spasms throughout thoracic paraspinals, rhomboids, levators as needed, progress band exercises, thoracic mobility    PT Home Exercise Plan  Access Code: [email or web address]    Consulted and Agree with Plan of Care  Patient       Patient will benefit from skilled therapeutic intervention in order to improve the following deficits and impairments:  Pain, Postural dysfunction, Increased muscle spasms, Impaired flexibility, Decreased range of motion, Impaired UE functional use, Increased fascial restricitons, Hypomobility  Visit Diagnosis: Other muscle spasm  Cervicalgia  Acute pain of right shoulder  Acute pain of left shoulder  Stiffness of left shoulder, not elsewhere classified  Stiffness of right shoulder, not elsewhere classified     Problem List There are no active problems to display for this patient.   Jule Ser, PT 01/18/2019, 4:19 PM  [facility] [address], [place], [place], [address] Phone: [phone]   Fax:  [phone]  Name: Cheryl Nichols MRN: [id number] Date of Birth: [birth year]

## 2019-01-20 ENCOUNTER — Other Ambulatory Visit: Payer: Self-pay

## 2019-01-20 ENCOUNTER — Ambulatory Visit: Payer: 59 | Admitting: Physical Therapy

## 2019-01-20 DIAGNOSIS — M542 Cervicalgia: Secondary | ICD-10-CM

## 2019-01-20 DIAGNOSIS — M25612 Stiffness of left shoulder, not elsewhere classified: Secondary | ICD-10-CM

## 2019-01-20 DIAGNOSIS — M25611 Stiffness of right shoulder, not elsewhere classified: Secondary | ICD-10-CM

## 2019-01-20 DIAGNOSIS — M25511 Pain in right shoulder: Secondary | ICD-10-CM

## 2019-01-20 DIAGNOSIS — M25512 Pain in left shoulder: Secondary | ICD-10-CM

## 2019-01-20 DIAGNOSIS — M62838 Other muscle spasm: Secondary | ICD-10-CM

## 2019-01-20 NOTE — Patient Instructions (Signed)
Updates to Access Code: [facility]

## 2019-01-20 NOTE — Therapy (Signed)
[facility] [address], [address] [place], [place], [address] Phone: [phone]   Fax:  [phone]  Physical Therapy Treatment  Patient Details  Name: Cheryl Nichols MRN: [id number] Date of Birth: March 28, 1970 Referring Provider (PT): [place], Betty G, MD   Encounter Date: 01/20/2019  PT End of Session - 01/20/19 1536    Visit Number  5    Date for PT Re-Evaluation  03/03/19    PT Start Time  1534    PT Stop Time  1614    PT Time Calculation (min)  40 min    Activity Tolerance  Patient tolerated treatment well;No increased pain    Behavior During Therapy  WFL for tasks assessed/performed       Past Medical History:  Diagnosis Date  . Hepatitis     Past Surgical History:  Procedure Laterality Date  . BREAST SURGERY    . ceasarean    . CESAREAN SECTION      There were no vitals filed for this visit.  Subjective Assessment - 01/20/19 1715    Subjective  States she feels a little better than last time.  Denies pain unless reaching    Currently in Pain?  No/denies                       [facility] Adult PT Treatment/Exercise - 01/20/19 0001      Shoulder Exercises: Supine   Horizontal ABduction  Strengthening;Both;20 reps;Theraband    Theraband Level (Shoulder Horizontal ABduction)  Level 2 (Red)    External Rotation  Strengthening;Both;20 reps;Theraband    Theraband Level (Shoulder External Rotation)  Level 2 (Red)    Diagonals  Strengthening;Both;10 reps;Theraband    Theraband Level (Shoulder Diagonals)  Level 2 (Red)      Shoulder Exercises: Standing   Extension  Strengthening;Both;20 reps;Theraband    Theraband Level (Shoulder Extension)  Level 3 (Green)      Shoulder Exercises: Pulleys   Flexion  2 minutes    ABduction  2 minutes    Other Pulley Exercises  IR x 1 min each side      Manual Therapy   Manual Therapy  Passive ROM    Soft tissue mobilization  Rt and Lt rhomboids, levator scap,  upper traps, thoracic paraspinals    Passive ROM  Rt shoulder ER and IR - 5 x 10 sec holds and long axis distraction 3 x 10 sec             PT Education - 01/20/19 1715    Education Details  Access Code: [email or web address]    Person(s) Educated  Patient    Methods  Explanation;Demonstration;Handout;Verbal cues    Comprehension  Verbalized understanding;Returned demonstration       PT Short Term Goals - 01/20/19 1713      PT SHORT TERM GOAL #1   Title  ind with initial HEP    Status  Achieved        PT Long Term Goals - 01/06/19 1704      PT LONG TERM GOAL #1   Title  ind with advanced HEP    Time  8    Period  Weeks    Status  New    Target Date  03/03/19      PT LONG TERM GOAL #2   Title  FOTO <or = to 28% limited    Time  8    Period  Weeks    Target Date  03/03/19      PT LONG TERM GOAL #3   Title  Pt will be able to put her coat on without increased pain    Baseline  difficult and has pain putting on coat    Time  8    Period  Weeks    Status  New    Target Date  03/03/19      PT LONG TERM GOAL #4   Title  Pt will be able to reach overhead in cabinets without using a step stool due to return of AROM bilateral UE.    Baseline  unable to reach cabinet without step    Time  8    Period  Weeks    Status  New    Target Date  03/03/19      PT LONG TERM GOAL #5   Title  Pt will report at least 60% reduction in pain overall during typical daily activities    Time  8    Period  Weeks    Status  New    Target Date  03/03/19            Plan - 01/20/19 1711    Clinical Impression Statement  Pt continues to demonstrate symptoms in line with frozen shoulder.  She has made progress with left shoulder doing ROM.  The right shoulder is still limited so more focus was spent on ROM for the Rt side today.  Pt tolerated treatment without increased pain at the end of the session.  Pt was able to progress strengthening and added band exercises to HEP using the red band  today.  Pt will benefit from skilled PT to progress strength and ROM for imporved overhead functional activities.    PT Treatment/Interventions  ADLs/Self Care Home Management;Biofeedback;Cryotherapy;Electrical Stimulation;Iontophoresis 4mg /ml Dexamethasone;Moist Heat;Traction;Neuromuscular re-education;Therapeutic exercise;Therapeutic activities;Patient/family education;Manual techniques;Taping;Dry needling;Passive range of motion    PT Next Visit Plan  pulleys, STM for muscle spasms throughout thoracic paraspinals, rhomboids, levators as needed, progress band exercises, thoracic mobility    PT Home Exercise Plan  Access Code: [email or web address]    Consulted and Agree with Plan of Care  Patient       Patient will benefit from skilled therapeutic intervention in order to improve the following deficits and impairments:  Pain, Postural dysfunction, Increased muscle spasms, Impaired flexibility, Decreased range of motion, Impaired UE functional use, Increased fascial restricitons, Hypomobility  Visit Diagnosis: Other muscle spasm  Cervicalgia  Acute pain of right shoulder  Acute pain of left shoulder  Stiffness of left shoulder, not elsewhere classified  Stiffness of right shoulder, not elsewhere classified     Problem List There are no active problems to display for this patient.   Camillo Flaming Savina Olshefski, PT 01/20/2019, 5:17 PM  [facility] [address], [place], [place], [address] Phone: [phone]   Fax:  [phone]  Name: Racine Erby MRN: [id number] Date of Birth: 1970-11-24

## 2019-01-26 ENCOUNTER — Other Ambulatory Visit: Payer: Self-pay

## 2019-01-26 ENCOUNTER — Encounter: Payer: Self-pay | Admitting: Physical Therapy

## 2019-01-26 ENCOUNTER — Ambulatory Visit: Payer: 59 | Admitting: Physical Therapy

## 2019-01-26 DIAGNOSIS — M62838 Other muscle spasm: Secondary | ICD-10-CM | POA: Diagnosis not present

## 2019-01-26 DIAGNOSIS — M25512 Pain in left shoulder: Secondary | ICD-10-CM

## 2019-01-26 DIAGNOSIS — M25511 Pain in right shoulder: Secondary | ICD-10-CM

## 2019-01-26 DIAGNOSIS — M25611 Stiffness of right shoulder, not elsewhere classified: Secondary | ICD-10-CM

## 2019-01-26 DIAGNOSIS — M542 Cervicalgia: Secondary | ICD-10-CM

## 2019-01-26 DIAGNOSIS — M25612 Stiffness of left shoulder, not elsewhere classified: Secondary | ICD-10-CM

## 2019-01-26 NOTE — Patient Instructions (Signed)
[email or web address] updated

## 2019-01-26 NOTE — Therapy (Signed)
[facility] [address], [place], [place], [address] Phone: [phone]   Fax:  [phone]  Physical Therapy Treatment  Patient Details  Name: Cheryl Nichols MRN: [id number] Date of Birth: 1970/09/30 Referring Provider (PT): [place], Betty G, MD   Encounter Date: 01/26/2019  PT End of Session - 01/26/19 1453    Visit Number  6    Date for PT Re-Evaluation  03/03/19    PT Start Time  1450    PT Stop Time  1528    PT Time Calculation (min)  38 min    Activity Tolerance  Patient tolerated treatment well;No increased pain    Behavior During Therapy  WFL for tasks assessed/performed       Past Medical History:  Diagnosis Date  . Hepatitis     Past Surgical History:  Procedure Laterality Date  . BREAST SURGERY    . ceasarean    . CESAREAN SECTION      There were no vitals filed for this visit.  Subjective Assessment - 01/26/19 1454    Subjective  Feels a little better.  No pain last night when sleeping.  The right side is worse.    Pertinent History  C5/6 disc narrowing on recent radiograph    Currently in Pain?  No/denies                       [facility] Adult PT Treatment/Exercise - 01/26/19 0001      Shoulder Exercises: Seated   Other Seated Exercises  upper trap stretch - 5 x 10 sec; thoracic ext/ shoulder flexion stretch with the blue ball in sitting - 10 x 5 sec      Shoulder Exercises: Standing   External Rotation  Strengthening;Right;Left;15 reps;Theraband    Theraband Level (Shoulder External Rotation)  Level 2 (Red)    External Rotation Limitations  isometric stepping out    Extension  Strengthening;Both;20 reps;Theraband    Theraband Level (Shoulder Extension)  Level 3 (Green)    Row  Strengthening;Both;20 reps;Theraband    Theraband Level (Shoulder Row)  Level 3 (Green)      Shoulder Exercises: Pulleys   Flexion  2 minutes    ABduction  2 minutes    Other Pulley Exercises  IR  x 1 min each side      Shoulder Exercises: ROM/Strengthening   "W" Arms  red band - standing - 20x    Other ROM/Strengthening Exercises  doorway stretch 20x each side      Manual Therapy   Soft tissue mobilization  Rt and Lt rhomboids, levator scap, upper traps, thoracic paraspinals             PT Education - 01/26/19 1531    Education Details  [email or web address]    Person(s) Educated  Patient    Methods  Explanation;Demonstration;Tactile cues;Verbal cues;Handout    Comprehension  Verbalized understanding;Returned demonstration       PT Short Term Goals - 01/20/19 1713      PT SHORT TERM GOAL #1   Title  ind with initial HEP    Status  Achieved        PT Long Term Goals - 01/06/19 1704      PT LONG TERM GOAL #1   Title  ind with advanced HEP    Time  8    Period  Weeks    Status  New    Target Date  03/03/19  PT LONG TERM GOAL #2   Title  FOTO <or = to 28% limited    Time  8    Period  Weeks    Target Date  03/03/19      PT LONG TERM GOAL #3   Title  Pt will be able to put her coat on without increased pain    Baseline  difficult and has pain putting on coat    Time  8    Period  Weeks    Status  New    Target Date  03/03/19      PT LONG TERM GOAL #4   Title  Pt will be able to reach overhead in cabinets without using a step stool due to return of AROM bilateral UE.    Baseline  unable to reach cabinet without step    Time  8    Period  Weeks    Status  New    Target Date  03/03/19      PT LONG TERM GOAL #5   Title  Pt will report at least 60% reduction in pain overall during typical daily activities    Time  8    Period  Weeks    Status  New    Target Date  03/03/19            Plan - 01/26/19 1535    Clinical Impression Statement  Pt had improved ROM at the end of the session after stretches and STM.  She was able to get bilateral shoulder flexion almost even when rolling the ball forward in sitting.  Pt will benefit from skilled PT to  continue to work on strength and ROM for improved UE function.    PT Treatment/Interventions  ADLs/Self Care Home Management;Biofeedback;Cryotherapy;Electrical Stimulation;Iontophoresis 4mg /ml Dexamethasone;Moist Heat;Traction;Neuromuscular re-education;Therapeutic exercise;Therapeutic activities;Patient/family education;Manual techniques;Taping;Dry needling;Passive range of motion    PT Next Visit Plan  progress strength and functional ROM as tolerated, thoracic and shoulder mobility, STM to rhomboids and levators and thoracic paraspinals as needed    PT Home Exercise Plan  Access Code: [email or web address]    Consulted and Agree with Plan of Care  Patient       Patient will benefit from skilled therapeutic intervention in order to improve the following deficits and impairments:  Pain, Postural dysfunction, Increased muscle spasms, Impaired flexibility, Decreased range of motion, Impaired UE functional use, Increased fascial restricitons, Hypomobility  Visit Diagnosis: Other muscle spasm  Cervicalgia  Acute pain of right shoulder  Acute pain of left shoulder  Stiffness of right shoulder, not elsewhere classified  Stiffness of left shoulder, not elsewhere classified     Problem List There are no active problems to display for this patient.   Danee Soller, PT 01/26/2019, 3:40 PM  [facility] [address], [address] [place], [place], [place] Phone: [phone]   Fax:  [phone]  Name: Cheryl Nichols MRN: Migdalia Dk Date of Birth: 1970-08-23

## 2019-02-01 ENCOUNTER — Ambulatory Visit: Payer: 59 | Admitting: Physical Therapy

## 2019-02-01 ENCOUNTER — Other Ambulatory Visit: Payer: Self-pay

## 2019-02-01 ENCOUNTER — Encounter: Payer: Self-pay | Admitting: Physical Therapy

## 2019-02-01 DIAGNOSIS — M542 Cervicalgia: Secondary | ICD-10-CM

## 2019-02-01 DIAGNOSIS — M25612 Stiffness of left shoulder, not elsewhere classified: Secondary | ICD-10-CM

## 2019-02-01 DIAGNOSIS — M62838 Other muscle spasm: Secondary | ICD-10-CM

## 2019-02-01 DIAGNOSIS — M25512 Pain in left shoulder: Secondary | ICD-10-CM

## 2019-02-01 DIAGNOSIS — M25611 Stiffness of right shoulder, not elsewhere classified: Secondary | ICD-10-CM

## 2019-02-01 DIAGNOSIS — M25511 Pain in right shoulder: Secondary | ICD-10-CM

## 2019-02-01 NOTE — Therapy (Signed)
[facility] [address] [place], [place], [address] Phone: [phone]   Fax:  [phone]  Physical Therapy Treatment  Patient Details  Name: Cheryl Nichols MRN: [id number] Date of Birth: 30-Apr-1970 Referring Provider (PT): [place], Betty G, MD   Encounter Date: 02/01/2019  PT End of Session - 02/01/19 1541    Visit Number  7    Date for PT Re-Evaluation  03/03/19    PT Start Time  8527    PT Stop Time  7824    PT Time Calculation (min)  41 min    Activity Tolerance  Patient tolerated treatment well;No increased pain    Behavior During Therapy  WFL for tasks assessed/performed       Past Medical History:  Diagnosis Date  . Hepatitis     Past Surgical History:  Procedure Laterality Date  . BREAST SURGERY    . ceasarean    . CESAREAN SECTION      There were no vitals filed for this visit.  Subjective Assessment - 02/01/19 1539    Subjective  The Rt side is still worse.  It is feeling like it is improving.  Holding the table clothe out to the side was very hard.    Pertinent History  C5/6 disc narrowing on recent radiograph    Currently in Pain?  No/denies         [facility] PT Assessment - 02/01/19 0001      AROM   Right Shoulder Flexion  36 Degrees    Right Shoulder ABduction  135 Degrees    Left Shoulder Extension  46 Degrees    Left Shoulder Flexion  155 Degrees                   [facility] Adult PT Treatment/Exercise - 02/01/19 0001      Shoulder Exercises: Prone   Other Prone Exercises  prone flex, ext, abd, row - 20x each bilat    Other Prone Exercises  child pose with threading UE for thoracic rotation 3 x 10 sec      Shoulder Exercises: Standing   Other Standing Exercises  wall 4 ways 15 x each circles and up/down, side to side      Shoulder Exercises: Pulleys   Flexion  2 minutes    ABduction  2 minutes      Manual Therapy   Soft tissue mobilization  upper trap bilateral,  thoracic paraspinalss       Trigger Point Dry Needling - 02/01/19 0001    Consent Given?  Yes    Education Handout Provided  Yes    Muscles Treated Head and Neck  Upper trapezius    Upper Trapezius Response  Twitch reponse elicited;Palpable increased muscle length             PT Short Term Goals - 02/01/19 1544      PT SHORT TERM GOAL #2   Title  pt will demonstrate shoulder flexion of at least 160 deg bilaterally for improved overhead reaching    Baseline  Lt 154; Rt 135      PT SHORT TERM GOAL #3   Title  Pt will report 7/10 pain at most    Baseline  7/10    Status  Achieved        PT Long Term Goals - 01/06/19 1704      PT LONG TERM GOAL #1   Title  ind with advanced HEP  Time  8    Period  Weeks    Status  New    Target Date  03/03/19      PT LONG TERM GOAL #2   Title  FOTO <or = to 28% limited    Time  8    Period  Weeks    Target Date  03/03/19      PT LONG TERM GOAL #3   Title  Pt will be able to put her coat on without increased pain    Baseline  difficult and has pain putting on coat    Time  8    Period  Weeks    Status  New    Target Date  03/03/19      PT LONG TERM GOAL #4   Title  Pt will be able to reach overhead in cabinets without using a step stool due to return of AROM bilateral UE.    Baseline  unable to reach cabinet without step    Time  8    Period  Weeks    Status  New    Target Date  03/03/19      PT LONG TERM GOAL #5   Title  Pt will report at least 60% reduction in pain overall during typical daily activities    Time  8    Period  Weeks    Status  New    Target Date  03/03/19            Plan - 02/01/19 1718    Clinical Impression Statement  Today's session focused on scapular mobility.  Pt responded well to dry needling on the Rt upper trap with loosening of soft tissue.  Pt performed exercises in prone with cues to increased scapula movement.  Overall, she has immporoved shoulder ROM of the left side, but  decreased on the Rt side.  Pt reports small improvements in function.  Pt will benefit from skilled PT to continue to work on imporved soft tissue mobility and ROM for return to maximum function.    PT Treatment/Interventions  ADLs/Self Care Home Management;Biofeedback;Cryotherapy;Electrical Stimulation;Iontophoresis 4mg /ml Dexamethasone;Moist Heat;Traction;Neuromuscular re-education;Therapeutic exercise;Therapeutic activities;Patient/family education;Manual techniques;Taping;Dry needling;Passive range of motion    PT Next Visit Plan  f/u on response to DN #1; scapular strength and mobility, shoulder ROM    PT Home Exercise Plan  Access Code: [email or web address]    Consulted and Agree with Plan of Care  Patient       Patient will benefit from skilled therapeutic intervention in order to improve the following deficits and impairments:  Pain, Postural dysfunction, Increased muscle spasms, Impaired flexibility, Decreased range of motion, Impaired UE functional use, Increased fascial restricitons, Hypomobility  Visit Diagnosis: Other muscle spasm  Cervicalgia  Acute pain of right shoulder  Acute pain of left shoulder  Stiffness of right shoulder, not elsewhere classified  Stiffness of left shoulder, not elsewhere classified     Problem List There are no active problems to display for this patient.   Cheryl Nichols, PT 02/01/2019, 5:31 PM  [facility] [address], [address] [place], [place], [place] Phone: [phone]   Fax:  [phone]  Name: Cheryl Nichols MRN: Migdalia Dk Date of Birth: 1970-08-04

## 2019-02-03 ENCOUNTER — Other Ambulatory Visit: Payer: Self-pay

## 2019-02-03 ENCOUNTER — Encounter: Payer: Self-pay | Admitting: Physical Therapy

## 2019-02-03 ENCOUNTER — Ambulatory Visit: Payer: 59 | Attending: Family Medicine | Admitting: Physical Therapy

## 2019-02-03 DIAGNOSIS — M25611 Stiffness of right shoulder, not elsewhere classified: Secondary | ICD-10-CM | POA: Diagnosis present

## 2019-02-03 DIAGNOSIS — M25511 Pain in right shoulder: Secondary | ICD-10-CM | POA: Diagnosis present

## 2019-02-03 DIAGNOSIS — M62838 Other muscle spasm: Secondary | ICD-10-CM

## 2019-02-03 DIAGNOSIS — M25612 Stiffness of left shoulder, not elsewhere classified: Secondary | ICD-10-CM | POA: Diagnosis present

## 2019-02-03 DIAGNOSIS — M25512 Pain in left shoulder: Secondary | ICD-10-CM | POA: Diagnosis present

## 2019-02-03 DIAGNOSIS — M542 Cervicalgia: Secondary | ICD-10-CM | POA: Diagnosis present

## 2019-02-03 NOTE — Therapy (Signed)
[facility] [address] [place], [place], [address] Phone: [phone]   Fax:  [phone]  Physical Therapy Treatment  Patient Details  Name: Deziray Nabi MRN: [id number] Date of Birth: 31-Aug-1970 Referring Provider (PT): Martin[place], Betty G, MD   Encounter Date: 02/03/2019  PT End of Session - 02/03/19 1457    Visit Number  8    Date for PT Re-Evaluation  03/03/19    PT Start Time  0254    PT Stop Time  2706    PT Time Calculation (min)  38 min    Activity Tolerance  Patient tolerated treatment well;No increased pain    Behavior During Therapy  WFL for tasks assessed/performed       Past Medical History:  Diagnosis Date  . Hepatitis     Past Surgical History:  Procedure Laterality Date  . BREAST SURGERY    . ceasarean    . CESAREAN SECTION      There were no vitals filed for this visit.  Subjective Assessment - 02/03/19 1456    Subjective  Pt states it still feels about the same.  Denies pain today, just tight.  I feel 70% improved    Currently in Pain?  No/denies                       [facility] Adult PT Treatment/Exercise - 02/03/19 0001      Shoulder Exercises: Standing   Other Standing Exercises  wall 4 ways 15 x each circles and up/down, side to side    Other Standing Exercises  ROM at wall 10x abd and flexion      Shoulder Exercises: Pulleys   Flexion  2 minutes    ABduction  2 minutes    Other Pulley Exercises  IR 10x each side      Shoulder Exercises: ROM/Strengthening   UBE (Upper Arm Bike)  L1 x 4 min PT present for update and FOTO      Manual Therapy   Joint Mobilization  grade III AP mobs in neutral Rt shoulder    Soft tissue mobilization  Rt shoulder pecs and deltoid    Passive ROM  Rt shoulder ER and IR - 5 x 10 sec holds and long axis distraction 3 x 10 sec               PT Short Term Goals - 02/03/19 1527      PT SHORT TERM GOAL #2   Title  pt will  demonstrate shoulder flexion of at least 160 deg bilaterally for improved overhead reaching    Baseline  Lt 154; Rt 136    Status  Not Met        PT Long Term Goals - 02/03/19 1509      PT LONG TERM GOAL #1   Title  ind with advanced HEP    Status  Achieved      PT LONG TERM GOAL #2   Title  FOTO <or = to 28% limited    Baseline  28%    Status  Achieved      PT LONG TERM GOAL #3   Title  Pt will be able to put her coat on without increased pain    Baseline  much less, still a little pain    Status  Partially Met      PT LONG TERM GOAL #4   Title  Pt will be able to reach  overhead in cabinets without using a step stool due to return of AROM bilateral UE.    Baseline  can reach without a step stool    Status  Achieved      PT LONG TERM GOAL #5   Title  Pt will report at least 60% reduction in pain overall during typical daily activities    Baseline  70% less pain    Status  Achieved            Plan - 02/03/19 1512    Clinical Impression Statement  Pt has met most of her goals.  She still has a little pain when putting on her coat and reaching back to the back seat.  She has made a lot of improvement in ROM in the Lt shoulder and continues to work on increased function and ROM of Rt shoulder.  Pt has reached a plateau due to nature of adhesive capsulitis.  She will benefit from d/c from skilled PT and continue with HEP at home.    PT Treatment/Interventions  ADLs/Self Care Home Management;Biofeedback;Cryotherapy;Electrical Stimulation;Iontophoresis '4mg'$ /ml Dexamethasone;Moist Heat;Traction;Neuromuscular re-education;Therapeutic exercise;Therapeutic activities;Patient/family education;Manual techniques;Taping;Dry needling;Passive range of motion    PT Next Visit Plan  d/c today    PT Home Exercise Plan  Access Code: [email or web address]    Consulted and Agree with Plan of Care  Patient       Patient will benefit from skilled therapeutic intervention in order to improve the following  deficits and impairments:  Pain, Postural dysfunction, Increased muscle spasms, Impaired flexibility, Decreased range of motion, Impaired UE functional use, Increased fascial restricitons, Hypomobility  Visit Diagnosis: Other muscle spasm  Cervicalgia  Acute pain of right shoulder  Acute pain of left shoulder  Stiffness of right shoulder, not elsewhere classified  Stiffness of left shoulder, not elsewhere classified     Problem List There are no active problems to display for this patient.   Camillo Flaming Davielle Lingelbach 02/03/2019, 3:32 PM  [facility] [address] [place], [place], [address] Phone: [phone]   Fax:  [phone]  Name: Marticia Reifschneider MRN: [id number] Date of Birth: 1970-05-05  PHYSICAL THERAPY DISCHARGE SUMMARY  Visits from Start of Care: 8  Current functional level related to goals / functional outcomes: See above goals   Remaining deficits: See above   Education / Equipment: HEP  Plan: Patient agrees to discharge.  Patient goals were partially met. Patient is being discharged due to being pleased with the current functional level.  ?????    Please with progress but also not showing much progress beyond where she is currently.  Most likely frozen shoulder and will clear up as she continues to work with [employer], PT 02/03/19 5:09 PM

## 2019-05-24 ENCOUNTER — Ambulatory Visit: Payer: 59 | Attending: Internal Medicine

## 2019-05-24 DIAGNOSIS — Z23 Encounter for immunization: Secondary | ICD-10-CM

## 2019-05-24 NOTE — Progress Notes (Signed)
   Covid-19 Vaccination Clinic  Name:  Cheryl Nichols    MRN: [id number] DOB: December 06, 1970  05/24/2019  [name] was observed post Covid-19 immunization for 15 minutes without incident. She was provided with Vaccine Information Sheet and instruction to access the V-Safe system.   [name] was instructed to call 911 with any severe reactions post vaccine: Marland Kitchen Difficulty breathing  . Swelling of face and throat  . A fast heartbeat  . A bad rash all over body  . Dizziness and weakness   Immunizations Administered    Name Date Dose VIS Date Route   Pfizer COVID-19 Vaccine 05/24/2019  2:17 PM 0.3 mL 02/12/2019 Intramuscular   Manufacturer: [employer], [employer]   Lot: [id number]   NDC: [address]-8

## 2019-06-14 ENCOUNTER — Ambulatory Visit: Payer: 59 | Attending: Internal Medicine

## 2019-06-14 DIAGNOSIS — Z23 Encounter for immunization: Secondary | ICD-10-CM

## 2019-06-14 NOTE — Progress Notes (Signed)
   Covid-19 Vaccination Clinic  Name:  Kasee Hantz    MRN: [id number] DOB: 1970/03/05  06/14/2019  Ms. Feliz was observed post Covid-19 immunization for 15 minutes without incident. She was provided with Vaccine Information Sheet and instruction to access the V-Safe system.   Ms. Lemere was instructed to call 911 with any severe reactions post vaccine: Marland Kitchen Difficulty breathing  . Swelling of face and throat  . A fast heartbeat  . A bad rash all over body  . Dizziness and weakness   Immunizations Administered    Name Date Dose VIS Date Route   Pfizer COVID-19 Vaccine 06/14/2019  1:42 PM 0.3 mL 02/12/2019 Intramuscular   Manufacturer: [employer], [employer]   Lot: [id number]   NDC: [address]-5

## 2019-09-22 ENCOUNTER — Other Ambulatory Visit: Payer: Self-pay

## 2019-09-22 ENCOUNTER — Encounter: Payer: Self-pay | Admitting: Obstetrics and Gynecology

## 2019-09-22 ENCOUNTER — Other Ambulatory Visit (HOSPITAL_COMMUNITY)
Admission: RE | Admit: 2019-09-22 | Discharge: 2019-09-22 | Disposition: A | Discharge status: Home / Self Care | Payer: 59 | Source: Ambulatory Visit | Attending: Obstetrics and Gynecology | Admitting: Obstetrics and Gynecology

## 2019-09-22 ENCOUNTER — Ambulatory Visit (INDEPENDENT_AMBULATORY_CARE_PROVIDER_SITE_OTHER): Payer: 59 | Admitting: Obstetrics and Gynecology

## 2019-09-22 VITALS — BP 108/72 | HR 73 | Wt 157.6 lb

## 2019-09-22 DIAGNOSIS — Z01419 Encounter for gynecological examination (general) (routine) without abnormal findings: Secondary | ICD-10-CM | POA: Insufficient documentation

## 2019-09-22 NOTE — Progress Notes (Signed)
Subjective:     Cheryl Nichols is a 49 y.o. female P2 with LMP 08/28/19 and BMI 25 who is here for a comprehensive physical exam. The patient reports no problems. She reports a monthly 6-day period. She denies pelvic pain or abnormal discharge. Patient is sexually active with same partner using natural family planning method for contraception. She denies dyspareunia or urinary incontinence.  Past Medical History:  Diagnosis Date  . Hepatitis    Past Surgical History:  Procedure Laterality Date  . BREAST SURGERY    . ceasarean    . CESAREAN SECTION     Family History  Problem Relation Age of Onset  . Breast cancer Neg Hx     Social History   Socioeconomic History  . Marital status: Married    Spouse name: Not on file  . Number of children: Not on file  . Years of education: Not on file  . Highest education level: Not on file  Occupational History  . Not on file  Tobacco Use  . Smoking status: Never Smoker  . Smokeless tobacco: Never Used  Vaping Use  . Vaping Use: Never used  Substance and Sexual Activity  . Alcohol use: No  . Drug use: No  . Sexual activity: Not on file  Other Topics Concern  . Not on file  Social History Narrative  . Not on file   Social Determinants of Health   Financial Resource Strain:   . Difficulty of Paying Living Expenses:   Food Insecurity:   . Worried About Programme researcher, broadcasting/film/video in the Last Year:   . Barista in the Last Year:   Transportation Needs:   . Freight forwarder (Medical):   Marland Kitchen Lack of Transportation (Non-Medical):   Physical Activity:   . Days of Exercise per Week:   . Minutes of Exercise per Session:   Stress:   . Feeling of Stress :   Social Connections:   . Frequency of Communication with Friends and Family:   . Frequency of Social Gatherings with Friends and Family:   . Attends Religious Services:   . Active Member of Clubs or Organizations:   . Attends Banker Meetings:   Marland Kitchen Marital Status:    Intimate Partner Violence:   . Fear of Current or Ex-Partner:   . Emotionally Abused:   Marland Kitchen Physically Abused:   . Sexually Abused:    Health Maintenance  Topic Date Due  . Hepatitis C Screening  Never done  . HIV Screening  Never done  . TETANUS/TDAP  Never done  . MAMMOGRAM  03/25/2019  . INFLUENZA VACCINE  10/03/2019  . PAP SMEAR-Modifier  02/11/2020  . COVID-19 Vaccine  Completed       Review of Systems Pertinent items noted in HPI and remainder of comprehensive ROS otherwise negative.   Objective:  Blood pressure 108/72, pulse 73, weight 157 lb 9.6 oz (71.5 kg), last menstrual period 08/28/2019.     GENERAL: Well-developed, well-nourished female in no acute distress.  HEENT: Normocephalic, atraumatic. Sclerae anicteric.  NECK: Supple. Normal thyroid.  LUNGS: Clear to auscultation bilaterally.  HEART: Regular rate and rhythm. BREASTS: Symmetric in size. No palpable masses or lymphadenopathy, skin changes, or nipple drainage. ABDOMEN: Soft, nontender, nondistended. No organomegaly. PELVIC: Normal external female genitalia. Vagina is pink and rugated.  Normal discharge. Normal appearing cervix. Uterus is normal in size. No adnexal mass or tenderness. EXTREMITIES: No cyanosis, clubbing, or edema, 2+ distal pulses.  Assessment:    Healthy female exam.      Plan:    Pap smear collected Screening mammogram ordered Patient will be contacted with abnormal results See After Visit Summary for Counseling Recommendations

## 2019-09-22 NOTE — Progress Notes (Signed)
Last pap: 02/10/2018

## 2019-09-27 LAB — CYTOLOGY - PAP
Adequacy: ABSENT
Comment: NEGATIVE
Diagnosis: NEGATIVE
High risk HPV: NEGATIVE

## 2019-10-22 ENCOUNTER — Ambulatory Visit
Admission: RE | Admit: 2019-10-22 | Discharge: 2019-10-22 | Disposition: A | Discharge status: Home / Self Care | Payer: 59 | Source: Ambulatory Visit | Attending: Obstetrics and Gynecology | Admitting: Obstetrics and Gynecology

## 2019-10-22 ENCOUNTER — Other Ambulatory Visit: Payer: Self-pay

## 2019-10-22 DIAGNOSIS — Z01419 Encounter for gynecological examination (general) (routine) without abnormal findings: Secondary | ICD-10-CM

## 2019-12-21 ENCOUNTER — Ambulatory Visit (INDEPENDENT_AMBULATORY_CARE_PROVIDER_SITE_OTHER): Payer: 59 | Admitting: Family Medicine

## 2019-12-21 ENCOUNTER — Encounter: Payer: Self-pay | Admitting: Family Medicine

## 2019-12-21 ENCOUNTER — Other Ambulatory Visit: Payer: Self-pay

## 2019-12-21 VITALS — BP 110/70 | HR 77 | Temp 97.9°F | Resp 12 | Ht 66.0 in | Wt 153.4 lb

## 2019-12-21 DIAGNOSIS — Z130 Encounter for screening for diseases of the blood and blood-forming organs and certain disorders involving the immune mechanism: Secondary | ICD-10-CM

## 2019-12-21 DIAGNOSIS — Z13220 Encounter for screening for lipoid disorders: Secondary | ICD-10-CM

## 2019-12-21 DIAGNOSIS — R635 Abnormal weight gain: Secondary | ICD-10-CM

## 2019-12-21 DIAGNOSIS — Z1159 Encounter for screening for other viral diseases: Secondary | ICD-10-CM | POA: Diagnosis not present

## 2019-12-21 DIAGNOSIS — Z0000 Encounter for general adult medical examination without abnormal findings: Secondary | ICD-10-CM | POA: Diagnosis not present

## 2019-12-21 DIAGNOSIS — Z23 Encounter for immunization: Secondary | ICD-10-CM

## 2019-12-21 DIAGNOSIS — Z1329 Encounter for screening for other suspected endocrine disorder: Secondary | ICD-10-CM

## 2019-12-21 DIAGNOSIS — Z13228 Encounter for screening for other metabolic disorders: Secondary | ICD-10-CM

## 2019-12-21 DIAGNOSIS — Z0189 Encounter for other specified special examinations: Secondary | ICD-10-CM

## 2019-12-21 NOTE — Progress Notes (Signed)
HPI: Cheryl Nichols is a pleasant 49 y.o. female, who is here today for her routine physical.  Last CPE: Over a year ago.  Regular exercise 3 or more time per week: Walking for an hour, zumba. Tries to exercise 2 times per week. Following a healthy diet: She is trying to lose wt, cooking at home, occasionally eats out. She lives with her husband and 2 children.  Chronic medical problems: Non contributory. Negative for hx of HTN,HLD,DM,or CAD. No hx of tobacco use. Hepatitis A years ago when living in [place].  Pap smear: 09/22/19. She follows with gyn regularly, last seen on 09/22/19.  Immunization History  Administered Date(s) Administered  . Influenza,inj,Quad PF,6+ Mos 12/21/2019  . PFIZER SARS-COV-2 Vaccination 05/24/2019, 06/14/2019  . Tdap 12/21/2019   Mammogram: 10/22/19. Colonoscopy:In [place] at age 49. Mother with hx of colon cancer. DEXA: N/A  Hep C screening: Never.  Concerns today: She has gained wt in the past year, so she is following with nutritionist in [place].  Nutritionist requested blood work done: CBC w dif,glucose,CMP,insulin,TSH.  Review of Systems  Constitutional: Negative for appetite change, fatigue and fever.  HENT: Negative for dental problem, hearing loss, mouth sores and sore throat.   Eyes: Negative for redness and visual disturbance.  Respiratory: Negative for cough, shortness of breath and wheezing.   Cardiovascular: Negative for chest pain and leg swelling.  Gastrointestinal: Negative for abdominal pain, nausea and vomiting.       No changes in bowel habits.  Endocrine: Negative for cold intolerance, heat intolerance, polydipsia, polyphagia and polyuria.  Genitourinary: Negative for decreased urine volume, dysuria, hematuria, vaginal bleeding and vaginal discharge.  Musculoskeletal: Negative for gait problem and myalgias.  Skin: Negative for color change and rash.  Allergic/Immunologic: Negative for environmental allergies.    Neurological: Negative for syncope, weakness and headaches.  Hematological: Negative for adenopathy. Does not bruise/bleed easily.  Psychiatric/Behavioral: Negative for confusion. The patient is not nervous/anxious.   All other systems reviewed and are negative.  No current outpatient medications on file prior to visit.   No current facility-administered medications on file prior to visit.   Past Medical History:  Diagnosis Date  . Hepatitis    Past Surgical History:  Procedure Laterality Date  . BREAST SURGERY    . ceasarean    . CESAREAN SECTION     No Known Allergies  Family History  Problem Relation Age of Onset  . Breast cancer Neg Hx     Social History   Socioeconomic History  . Marital status: Married    Spouse name: Not on file  . Number of children: Not on file  . Years of education: Not on file  . Highest education level: Not on file  Occupational History  . Not on file  Tobacco Use  . Smoking status: Never Smoker  . Smokeless tobacco: Never Used  Vaping Use  . Vaping Use: Never used  Substance and Sexual Activity  . Alcohol use: No  . Drug use: No  . Sexual activity: Not on file  Other Topics Concern  . Not on file  Social History Narrative  . Not on file   Social Determinants of Health   Financial Resource Strain:   . Difficulty of Paying Living Expenses: Not on file  Food Insecurity:   . Worried About Programme researcher, broadcasting/film/videounning Out of Food in the Last Year: Not on file  . Ran Out of Food in the Last Year: Not on file  Transportation Needs:   .  Lack of Transportation (Medical): Not on file  . Lack of Transportation (Non-Medical): Not on file  Physical Activity:   . Days of Exercise per Week: Not on file  . Minutes of Exercise per Session: Not on file  Stress:   . Feeling of Stress : Not on file  Social Connections:   . Frequency of Communication with Friends and Family: Not on file  . Frequency of Social Gatherings with Friends and Family: Not on file  .  Attends Religious Services: Not on file  . Active Member of Clubs or Organizations: Not on file  . Attends Banker Meetings: Not on file  . Marital Status: Not on file    Vitals:   12/21/19 0900  BP: 110/70  Pulse: 77  Resp: 12  Temp: 97.9 F (36.6 C)  SpO2: 99%   Body mass index is 24.76 kg/m.  Wt Readings from Last 3 Encounters:  12/21/19 153 lb 6 oz (69.6 kg)  09/22/19 157 lb 9.6 oz (71.5 kg)  12/25/18 162 lb (73.5 kg)   Physical Exam Vitals and nursing note reviewed.  Constitutional:      General: She is not in acute distress.    Appearance: She is well-developed and normal weight.  HENT:     Head: Normocephalic and atraumatic.     Right Ear: Hearing, tympanic membrane, ear canal and external ear normal.     Left Ear: Hearing, tympanic membrane, ear canal and external ear normal.     Mouth/Throat:     Mouth: Mucous membranes are moist.     Pharynx: Oropharynx is clear. Uvula midline.  Eyes:     Extraocular Movements: Extraocular movements intact.     Conjunctiva/sclera: Conjunctivae normal.     Pupils: Pupils are equal, round, and reactive to light.  Neck:     Thyroid: No thyromegaly.     Trachea: No tracheal deviation.  Cardiovascular:     Rate and Rhythm: Normal rate and regular rhythm.     Pulses:          Dorsalis pedis pulses are 2+ on the right side and 2+ on the left side.     Heart sounds: No murmur heard.   Pulmonary:     Effort: Pulmonary effort is normal. No respiratory distress.     Breath sounds: Normal breath sounds.  Abdominal:     Palpations: Abdomen is soft. There is no hepatomegaly or mass.     Tenderness: There is no abdominal tenderness.  Genitourinary:    Comments: Deferred to gyn. Musculoskeletal:     Comments: No major deformity or signs of synovitis appreciated.  Lymphadenopathy:     Cervical: No cervical adenopathy.     Upper Body:     Right upper body: No supraclavicular adenopathy.     Left upper body: No  supraclavicular adenopathy.  Skin:    General: Skin is warm.     Findings: No erythema or rash.  Neurological:     General: No focal deficit present.     Mental Status: She is alert and oriented to person, place, and time.     Cranial Nerves: No cranial nerve deficit.     Coordination: Coordination normal.     Gait: Gait normal.     Deep Tendon Reflexes:     Reflex Scores:      Bicep reflexes are 2+ on the right side and 2+ on the left side.      Patellar reflexes are 2+ on the  right side and 2+ on the left side. Psychiatric:        Mood and Affect: Mood and affect normal.        Speech: Speech normal.     Comments: Well groomed, good eye contact.   ASSESSMENT AND PLAN:  Ms. Alleene Stoy was here today annual physical examination.  Orders Placed This Encounter  Procedures  . Flu Vaccine QUAD 36+ mos IM  . Tdap vaccine greater than or equal to 7yo IM  . COMPLETE METABOLIC PANEL WITH GFR  . Hemoglobin A1c  . Lipid panel  . TSH  . Hepatitis C antibody   Lab Results  Component Value Date   CREATININE 0.63 12/21/2019   BUN 9 12/21/2019   NA 139 12/21/2019   K 4.1 12/21/2019   CL 104 12/21/2019   CO2 26 12/21/2019   Lab Results  Component Value Date   ALT 13 12/21/2019   AST 14 12/21/2019   ALKPHOS 102 02/10/2018   BILITOT 0.4 12/21/2019   Lab Results  Component Value Date   CHOL 158 12/21/2019   HDL 77 12/21/2019   LDLCALC 67 12/21/2019   TRIG 56 12/21/2019   CHOLHDL 2.1 12/21/2019   Routine general medical examination at a health care facility We discussed the importance of regular physical activity and healthy diet for prevention of chronic illness and/or complications. Preventive guidelines reviewed. Vaccination updated. Continue female preventive care with gyn. Next CPE in a year.  The 10-year ASCVD risk score Denman George DC Montez Hageman., et al., 2013) is: 0.3%   Values used to calculate the score:     Age: 34 years     Sex: Female     Is Non-Hispanic African  American: No     Diabetic: No     Tobacco smoker: No     Systolic Blood Pressure: 110 mmHg     Is BP treated: No     HDL Cholesterol: 77 mg/dL     Total Cholesterol: 158 mg/dL  Encounter for HCV screening test for low risk patient -     Hepatitis C antibody  Screening for lipoid disorders -     Lipid panel  Screening for endocrine, metabolic and immunity disorder -     Hemoglobin A1c -     COMPLETE METABOLIC PANEL WITH GFR  Weight gain BMI is still normal. Consistently with healthy life style habits recommended.  She prefers to hold on some of the requested tests because her health insurance may not cover.  Need for influenza vaccination -     Flu Vaccine QUAD 36+ mos IM  Need for Tdap vaccination -     Tdap vaccine greater than or equal to 7yo IM   Return in 1 year (on 12/20/2020) for cpe.   Chasidy Janak G. [place], MD  [facility]. [facility].  Today you have you routine preventive visit. A few things to remember from today's visit:  Routine general medical examination at a health care facility  Encounter for HCV screening test for low risk patient - Plan: Hepatitis C antibody  Patient requested diagnostic testing  Screening for lipoid disorders - Plan: Lipid panel  Screening for endocrine, metabolic and immunity disorder - Plan: COMPLETE METABOLIC PANEL WITH GFR, Hemoglobin A1c  Weight gain - Plan: TSH  Please be sure medication list is accurate. If a new problem present, please set up appointment sooner than planned today.  At least 150 minutes of moderate exercise per week, daily brisk walking for 15-30  min is a good exercise option. Healthy diet low in saturated (animal) fats and sweets and consisting of fresh fruits and vegetables, lean meats such as fish and white chicken and whole grains.  These are some of recommendations for screening depending of age and risk factors:  - Vaccines:  Tdap vaccine every 10 years.  Shingles vaccine  recommended at age 27, could be given after 47 years of age but not sure about insurance coverage.   Pneumonia vaccines: Pneumovax at 65. Sometimes Pneumovax is giving earlier if history of smoking, lung disease,diabetes,kidney disease among some.  Screening for diabetes at age 34 and every 3 years.  Cervical cancer prevention:  Pap smear starts at 47 years of age and continues periodically until 47 years old in low risk women. Pap smear every 3 years between 32 and 28 years old. Pap smear every 3-5 years between women 30 and older if pap smear negative and HPV screening negative.   -Breast cancer: Mammogram: There is disagreement between experts about when to start screening in low risk asymptomatic female but recent recommendations are to start screening at 49 and not later than 47 years old , every 1-2 years and after 47 yo q 2 years. Screening is recommended until 47 years old but some women can continue screening depending of healthy issues.  Colon cancer screening: Has been recently changed to 47 yo. Insurance may not cover until you are 47 years old. Screening is recommended until 47 years old.  Cholesterol disorder screening at age 23 and every 3 years.  Also recommended:  1. Dental visit- Brush and floss your teeth twice daily; visit your dentist twice a year. 2. Eye doctor- Get an eye exam at least every 2 years. 3. Helmet use- Always wear a helmet when riding a bicycle, motorcycle, rollerblading or skateboarding. 4. Safe sex- If you may be exposed to sexually transmitted infections, use a condom. 5. Seat belts- Seat belts can save your live; always wear one. 6. Smoke/Carbon Monoxide detectors- These detectors need to be installed on the appropriate level of your home. Replace batteries at least once a year. 7. Skin cancer- When out in the sun please cover up and use sunscreen 15 SPF or higher. 8. Violence- If anyone is threatening or hurting you, please tell your healthcare  provider.  9. Drink alcohol in moderation- Limit alcohol intake to one drink or less per day. Never drink and drive. 10. Calcium supplementation 1000 to 1200 mg daily, ideally through your diet.  Vitamin D supplementation 800 units daily.

## 2019-12-21 NOTE — Patient Instructions (Addendum)
Today you have you routine preventive visit. A few things to remember from today's visit:  Routine general medical examination at a health care facility  Encounter for HCV screening test for low risk patient - Plan: Hepatitis C antibody  Patient requested diagnostic testing  Screening for lipoid disorders - Plan: Lipid panel  Screening for endocrine, metabolic and immunity disorder - Plan: COMPLETE METABOLIC PANEL WITH GFR, Hemoglobin A1c  Weight gain - Plan: TSH  Please be sure medication list is accurate. If a new problem present, please set up appointment sooner than planned today.  At least 150 minutes of moderate exercise per week, daily brisk walking for 15-30 min is a good exercise option. Healthy diet low in saturated (animal) fats and sweets and consisting of fresh fruits and vegetables, lean meats such as fish and white chicken and whole grains.  These are some of recommendations for screening depending of age and risk factors:  - Vaccines:  Tdap vaccine every 10 years.  Shingles vaccine recommended at age 53, could be given after 47 years of age but not sure about insurance coverage.   Pneumonia vaccines: Pneumovax at 65. Sometimes Pneumovax is giving earlier if history of smoking, lung disease,diabetes,kidney disease among some.  Screening for diabetes at age 46 and every 3 years.  Cervical cancer prevention:  Pap smear starts at 47 years of age and continues periodically until 47 years old in low risk women. Pap smear every 3 years between 27 and 24 years old. Pap smear every 3-5 years between women 30 and older if pap smear negative and HPV screening negative.   -Breast cancer: Mammogram: There is disagreement between experts about when to start screening in low risk asymptomatic female but recent recommendations are to start screening at 1 and not later than 47 years old , every 1-2 years and after 47 yo q 2 years. Screening is recommended until 47 years old but  some women can continue screening depending of healthy issues.  Colon cancer screening: Has been recently changed to 47 yo. Insurance may not cover until you are 46 years old. Screening is recommended until 47 years old.  Cholesterol disorder screening at age 32 and every 3 years.  Also recommended:  1. Dental visit- Brush and floss your teeth twice daily; visit your dentist twice a year. 2. Eye doctor- Get an eye exam at least every 2 years. 3. Helmet use- Always wear a helmet when riding a bicycle, motorcycle, rollerblading or skateboarding. 4. Safe sex- If you may be exposed to sexually transmitted infections, use a condom. 5. Seat belts- Seat belts can save your live; always wear one. 6. Smoke/Carbon Monoxide detectors- These detectors need to be installed on the appropriate level of your home. Replace batteries at least once a year. 7. Skin cancer- When out in the sun please cover up and use sunscreen 15 SPF or higher. 8. Violence- If anyone is threatening or hurting you, please tell your healthcare provider.  9. Drink alcohol in moderation- Limit alcohol intake to one drink or less per day. Never drink and drive. 10. Calcium supplementation 1000 to 1200 mg daily, ideally through your diet.  Vitamin D supplementation 800 units daily.

## 2019-12-22 LAB — COMPLETE METABOLIC PANEL WITH GFR
AG Ratio: 1.6 (calc) (ref 1.0–2.5)
ALT: 13 U/L (ref 6–29)
AST: 14 U/L (ref 10–35)
Albumin: 4.5 g/dL (ref 3.6–5.1)
Alkaline phosphatase (APISO): 89 U/L (ref 31–125)
BUN: 9 mg/dL (ref 7–25)
CO2: 26 mmol/L (ref 20–32)
Calcium: 9.2 mg/dL (ref 8.6–10.2)
Chloride: 104 mmol/L (ref 98–110)
Creat: 0.63 mg/dL (ref 0.50–1.10)
GFR, Est African American: 124 mL/min/{1.73_m2} (ref >=60)
GFR, Est Non African American: 107 mL/min/{1.73_m2} (ref >=60)
Globulin: 2.8 g/dL (calc) (ref 1.9–3.7)
Glucose, Bld: 95 mg/dL (ref 65–99)
Potassium: 4.1 mmol/L (ref 3.5–5.3)
Sodium: 139 mmol/L (ref 135–146)
Total Bilirubin: 0.4 mg/dL (ref 0.2–1.2)
Total Protein: 7.3 g/dL (ref 6.1–8.1)

## 2019-12-22 LAB — LIPID PANEL
Cholesterol: 158 mg/dL (ref <200)
HDL: 77 mg/dL (ref >=50)
LDL Cholesterol (Calc): 67 mg/dL (calc)
Non-HDL Cholesterol (Calc): 81 mg/dL (calc) (ref <130)
Total CHOL/HDL Ratio: 2.1 (calc) (ref <5.0)
Triglycerides: 56 mg/dL (ref <150)

## 2019-12-22 LAB — HEMOGLOBIN A1C
Hgb A1c MFr Bld: 5.3 % of total Hgb (ref <5.7)
Mean Plasma Glucose: 105 (calc)
eAG (mmol/L): 5.8 (calc)

## 2019-12-22 LAB — HEPATITIS C ANTIBODY
Hepatitis C Ab: NONREACTIVE
SIGNAL TO CUT-OFF: 0.14 (ref <1.00)

## 2019-12-22 LAB — TSH: TSH: 1.17 mIU/L

## 2020-10-17 ENCOUNTER — Other Ambulatory Visit: Payer: Self-pay

## 2020-10-18 ENCOUNTER — Encounter: Payer: Self-pay | Admitting: Family Medicine

## 2020-10-18 ENCOUNTER — Ambulatory Visit: Payer: 59 | Admitting: Family Medicine

## 2020-10-18 VITALS — BP 90/62 | HR 68 | Temp 98.1°F | Ht 66.0 in | Wt 157.0 lb

## 2020-10-18 DIAGNOSIS — L723 Sebaceous cyst: Secondary | ICD-10-CM | POA: Diagnosis not present

## 2020-10-18 MED ORDER — DOXYCYCLINE HYCLATE 100 MG PO TABS: 100.0000 mg | ORAL_TABLET | Freq: Two times a day (BID) | ORAL | 0 refills | Status: AC

## 2020-10-18 NOTE — Progress Notes (Addendum)
  Cheryl Nichols DOB: [birth year] Encounter date: 10/18/2020  This is a 50 y.o. female who presents with Chief Complaint  Patient presents with   Cyst    Patient complains a cyst on her upper back x2 weeks, seen by a dermatologist for the same problem 1 year ago, next appointment with the dermatologist is scheduled for October    History of present illness:  Has grown in last 2 weeks, feels like it is infected because it hurts. Last check with derm she was told it was ok. Didn't bother her until 2 weeks ago. Not draining. No fevers, chills. This is first time it has gotten sore.    No Known Allergies Current Meds  Medication Sig   doxycycline (VIBRA-TABS) 100 MG tablet Take 1 tablet (100 mg total) by mouth 2 (two) times daily for 5 days.    Review of Systems  Constitutional:  Negative for chills, fatigue and fever.  Respiratory:  Negative for cough, chest tightness, shortness of breath and wheezing.   Cardiovascular:  Negative for chest pain, palpitations and leg swelling.  Skin:        Sore lump upper back   Objective:  BP 90/62 (BP Location: Left Arm, Patient Position: Sitting, Cuff Size: Normal)   Pulse 68   Temp 98.1 F (36.7 C) (Oral)   Ht 5\' 6"  (1.676 m)   Wt 157 lb (71.2 kg)   LMP 10/08/2020   SpO2 99%   BMI 25.34 kg/m   Weight: 157 lb (71.2 kg)   BP Readings from Last 3 Encounters:  10/18/20 90/62  12/21/19 110/70  09/22/19 108/72   Wt Readings from Last 3 Encounters:  10/18/20 157 lb (71.2 kg)  12/21/19 153 lb 6 oz (69.6 kg)  09/22/19 157 lb 9.6 oz (71.5 kg)    Physical Exam Constitutional:      Appearance: Normal appearance.  Skin:    Comments: Upper back there is 1.25cm tender pustule with slightly dark center - appears to be inflamed sebaceous cyst.   Neurological:     Mental Status: She is alert.   Diagnosis: epidermal inclusion cyst - Location: upper back Procedure: Incision & drainage Informed consent:  Discussed risks (permanent loss of  nail, permanent irregular growth of nail, infection, pain, bleeding, bruising, numbness, and recurrence of the condition) and benefits of the procedure, as well as the alternatives.  Informed consent was obtained. Anesthesia: lidicaine 1% w epi Type: total The area was prepared and draped in a standard fashion. The lesion drained puss, thick, sebaceous material, and blood. The patient tolerated the procedure well. Wound was dressed with sterile dressing. The patient was instructed on post-op care.   Assessment/Plan 1. Sebaceous cyst Wound care discussed. Take antibiotic as directed.   Return if symptoms worsen or fail to improve.      09/24/19, MD

## 2020-10-18 NOTE — Patient Instructions (Signed)
Keep wound clean - ok to apply gauze over wound to contain drainage. Ok to wash with soap/water in shower.  If increased pain or redness call the office  Take doxycycline twice daily for at least 3 days; up to 5 days if still tenderness in wound area.

## 2020-10-30 ENCOUNTER — Other Ambulatory Visit: Payer: Self-pay

## 2020-10-31 ENCOUNTER — Ambulatory Visit: Payer: 59 | Admitting: Family Medicine

## 2020-10-31 ENCOUNTER — Encounter: Payer: Self-pay | Admitting: Family Medicine

## 2020-10-31 VITALS — BP 118/70 | HR 68 | Temp 98.3°F | Resp 12 | Ht 66.0 in | Wt 157.0 lb

## 2020-10-31 DIAGNOSIS — E049 Nontoxic goiter, unspecified: Secondary | ICD-10-CM

## 2020-10-31 DIAGNOSIS — M542 Cervicalgia: Secondary | ICD-10-CM

## 2020-10-31 DIAGNOSIS — R002 Palpitations: Secondary | ICD-10-CM | POA: Diagnosis not present

## 2020-10-31 DIAGNOSIS — R590 Localized enlarged lymph nodes: Secondary | ICD-10-CM | POA: Diagnosis not present

## 2020-10-31 LAB — BASIC METABOLIC PANEL
BUN: 10 mg/dL (ref 6–23)
CO2: 26 mEq/L (ref 19–32)
Calcium: 9.3 mg/dL (ref 8.4–10.5)
Chloride: 102 mEq/L (ref 96–112)
Creatinine, Ser: 0.61 mg/dL (ref 0.40–1.20)
GFR: 105.74 mL/min (ref >60.00)
Glucose, Bld: 82 mg/dL (ref 70–99)
Potassium: 4 mEq/L (ref 3.5–5.1)
Sodium: 136 mEq/L (ref 135–145)

## 2020-10-31 LAB — CBC
HCT: 39 % (ref 36.0–46.0)
Hemoglobin: 13 g/dL (ref 12.0–15.0)
MCHC: 33.3 g/dL (ref 30.0–36.0)
MCV: 89.2 fl (ref 78.0–100.0)
Platelets: 235 10*3/uL (ref 150.0–400.0)
RBC: 4.37 Mil/uL (ref 3.87–5.11)
RDW: 13 % (ref 11.5–15.5)
WBC: 6.5 10*3/uL (ref 4.0–10.5)

## 2020-10-31 LAB — TSH: TSH: 0.87 u[IU]/mL (ref 0.35–5.50)

## 2020-10-31 MED ORDER — TIZANIDINE HCL 4 MG PO TABS: 2.0000 mg | ORAL_TABLET | Freq: Every evening | ORAL | 1 refills | Status: DC | PRN

## 2020-10-31 NOTE — Progress Notes (Signed)
Chief Complaint  Patient presents with   Follow-up  HPI:  [name] is a 50 y.o. female, who is here today to follow on recent OV. She was seen on 10/18/2020, diagnosed with sebaceous cyst on upper back. S/P I&D, started on Doxycycline 100 mg bid. Lesion has greatly improved and she has an appt with her dermatologist in 12/2020.  Today she is c/o bilateral cervical pain, which has been going on for 2 years but seems to be getting worse. It happens when she is in bed, resolves when she gets up and starts moving. Pain is sometimes radiated to occipital areas, causing headaches. It is not radiated to UE's. No associated numbness,tingling,burning,or skin rash. No limitation of ROM.  It is alleviated with exercise, which she has not been doing consistently. She has not tried OTC medications. She has been under some stress.  3-4 months of worsening palpitations, usually at night when she is in bed.  Alleviated by breathing exercise and changing positions. She is not sure about duration of episodes, she usually falls asleep. Palpitations have woken her up in the middle of the night.  Seldom during the day. About 5 months ago she had an episode while doing chores around the house, checked HR with pulse ox and it was 144/min. No associated dizziness,CP,SOB, orthopnea,PND,diaphoresis,nausea,or edema.  Palpitations are not exacerbated by exertion. She does not consumed a lot of caffeine.  Lab Results  Component Value Date   TSH 1.17 12/21/2019   Lab Results  Component Value Date   CREATININE 0.63 12/21/2019   BUN 9 12/21/2019   NA 139 12/21/2019   K 4.1 12/21/2019   CL 104 12/21/2019   CO2 26 12/21/2019   Review of Systems  Constitutional:  Positive for fatigue. Negative for activity change, appetite change and fever.  HENT:  Negative for mouth sores, nosebleeds, sore throat and trouble swallowing.   Eyes:  Negative for redness and visual disturbance.  Respiratory:   Negative for cough and wheezing.   Gastrointestinal:  Negative for abdominal pain and vomiting.       Negative for changes in bowel habits.  Endocrine: Negative for cold intolerance and heat intolerance.  Genitourinary:  Negative for decreased urine volume and hematuria.  Musculoskeletal:  Negative for gait problem.  Neurological:  Negative for syncope, facial asymmetry and weakness.  Hematological:  Negative for adenopathy. Does not bruise/bleed easily.  Psychiatric/Behavioral:  Negative for confusion. The patient is not nervous/anxious.   Rest see pertinent positives and negatives per HPI.  No current outpatient medications on file prior to visit.   No current facility-administered medications on file prior to visit.   Past Medical History:  Diagnosis Date   Hepatitis    No Known Allergies  Social History   Socioeconomic History   Marital status: Married    Spouse name: Not on file   Number of children: Not on file   Years of education: Not on file   Highest education level: Not on file  Occupational History   Not on file  Tobacco Use   Smoking status: Never   Smokeless tobacco: Never  Vaping Use   Vaping Use: Never used  Substance and Sexual Activity   Alcohol use: No   Drug use: No   Sexual activity: Not on file  Other Topics Concern   Not on file  Social History Narrative   Not on file   Social Determinants of Health   Financial Resource Strain: Not on file  Food  Insecurity: Not on file  Transportation Needs: Not on file  Physical Activity: Not on file  Stress: Not on file  Social Connections: Not on file    Vitals:   10/31/20 0932  BP: 118/70  Pulse: 68  Resp: 12  Temp: 98.3 F (36.8 C)  SpO2: 98%   Body mass index is 25.34 kg/m.  Physical Exam Vitals and nursing note reviewed.  Constitutional:      General: She is not in acute distress.    Appearance: She is well-developed.  HENT:     Head: Normocephalic and atraumatic.      Mouth/Throat:     Mouth: Mucous membranes are moist.     Pharynx: Oropharynx is clear.  Eyes:     Conjunctiva/sclera: Conjunctivae normal.  Neck:     Thyroid: Thyroid mass (? small nodule left side.) present. No thyromegaly.  Cardiovascular:     Rate and Rhythm: Normal rate and regular rhythm.     Pulses:          Dorsalis pedis pulses are 2+ on the right side and 2+ on the left side.     Heart sounds: No murmur heard. Pulmonary:     Effort: Pulmonary effort is normal. No respiratory distress.     Breath sounds: Normal breath sounds.  Abdominal:     Palpations: Abdomen is soft. There is no hepatomegaly or mass.     Tenderness: There is no abdominal tenderness.  Musculoskeletal:     Cervical back: Spasms present. No edema, erythema, tenderness or bony tenderness. No pain with movement. Normal range of motion.     Thoracic back: Spasms present. No tenderness or bony tenderness.  Lymphadenopathy:     Cervical: No cervical adenopathy.  Skin:    General: Skin is warm.     Findings: No erythema or rash.  Neurological:     General: No focal deficit present.     Mental Status: She is alert and oriented to person, place, and time.     Cranial Nerves: No cranial nerve deficit.     Gait: Gait normal.  Psychiatric:     Comments: Well groomed, good eye contact.    ASSESSMENT AND PLAN:  [name] was seen today for follow-up.  Diagnoses and all orders for this visit: Orders Placed This Encounter  Procedures   US THYROID   CBC   Basic metabolic panel   TSH   EKG 12-Lead   Lab Results  Component Value Date   CREATININE 0.61 10/31/2020   BUN 10 10/31/2020   NA 136 10/31/2020   K 4.0 10/31/2020   CL 102 10/31/2020   CO2 26 10/31/2020   Lab Results  Component Value Date   TSH 0.87 10/31/2020   Lab Results  Component Value Date   WBC 6.5 10/31/2020   HGB 13.0 10/31/2020   HCT 39.0 10/31/2020   MCV 89.2 10/31/2020   PLT 235.0 10/31/2020   Palpitations We discussed  possible etiologies. Most aspects of hx and examination today do not suggest a serious process. Cardiac monitor will be useful. EKG today: NSR,normal axis and intervals. No other EKG for comparison. She prefers to hold on cardiologist until lab results are back. Instructed about warning signs.  Enlarged thyroid gland ? Small thyroid nodule on examination. Further recommendations according to lab and imaging results.  Cervicalgia Chronic. She prefers to hold on PT. Resume ROM exercises.  Some side effects of Zanaflex discussed, she can take 2-4 mg at bedtime as needed.  -  tiZANidine (ZANAFLEX) 4 MG tablet; Take 0.5-1 tablets (2-4 mg total) by mouth at bedtime as needed for muscle spasms.  Return if symptoms worsen or fail to improve, for According to lab and imaging results..  Rashi Granier G. [place], MD  [facility]. [place] office.

## 2020-10-31 NOTE — Patient Instructions (Addendum)
A few things to remember from today's visit:   Palpitations - Plan: EKG 12-Lead, CBC, Basic metabolic panel, TSH  Enlarged thyroid gland - Plan: US THYROID, TSH  Cervicalgia - Plan: tiZANidine (ZANAFLEX) 4 MG tablet  Muscle relaxant at bedtime and resuming neck exercises will help. We could consider PT again, please let me know if you are interested later on. If labs and   Palpitations Palpitations are feelings that your heartbeat is irregular or is faster than normal. It may feel like your heart is fluttering or skipping a beat. Palpitations are usually not a serious problem. They may be caused by many things, including smoking, caffeine, alcohol, stress, and certain medicines or drugs. Most causes of palpitations are not serious. However, some palpitations can be a sign of a serious problem. You may need further tests to rule outserious medical problems. Follow these instructions at home:     Pay attention to any changes in your condition. Take these actions to helpmanage your symptoms: Eating and drinking Avoid foods and drinks that may cause palpitations. These may include: Caffeinated coffee, tea, soft drinks, diet pills, and energy drinks. Chocolate. Alcohol. Lifestyle Take steps to reduce your stress and anxiety. Things that can help you relax include: Yoga. Mind-body activities, such as deep breathing, meditation, or using words and images to create positive thoughts (guided imagery). Physical activity, such as swimming, jogging, or walking. Tell your health care provider if your palpitations increase with activity. If you have chest pain or shortness of breath with activity, do not continue the activity until you are seen by your health care provider. Biofeedback. This is a method that helps you learn to use your mind to control things in your body, such as your heartbeat. Do not use drugs, including cocaine or ecstasy. Do not use marijuana. Get plenty of rest and sleep. Keep  a regular bed time. General instructions Take over-the-counter and prescription medicines only as told by your health care provider. Do not use any products that contain nicotine or tobacco, such as cigarettes and e-cigarettes. If you need help quitting, ask your health care provider. Keep all follow-up visits as told by your health care provider. This is important. These may include visits for further testing if palpitations do not go away or get worse. Contact a health care provider if you: Continue to have a fast or irregular heartbeat after 24 hours. Notice that your palpitations occur more often. Get help right away if you: Have chest pain or shortness of breath. Have a severe headache. Feel dizzy or you faint. Summary Palpitations are feelings that your heartbeat is irregular or is faster than normal. It may feel like your heart is fluttering or skipping a beat. Palpitations may be caused by many things, including smoking, caffeine, alcohol, stress, certain medicines, and drugs. Although most causes of palpitations are not serious, some causes can be a sign of a serious medical problem. Get help right away if you faint or have chest pain, shortness of breath, a severe headache, or dizziness. This information is not intended to replace advice given to you by your health care provider. Make sure you discuss any questions you have with your healthcare provider. Document Revised: 04/02/2017 Document Reviewed: 04/02/2017 Elsevier Patient Education  2022 Elsevier Inc.  Do not use My Chart to request refills or for acute issues that need immediate attention.    Please be sure medication list is accurate. If a new problem present, please set up appointment sooner than planned  today.

## 2020-11-02 ENCOUNTER — Other Ambulatory Visit: Payer: 59

## 2020-11-03 ENCOUNTER — Other Ambulatory Visit: Payer: Self-pay

## 2020-11-03 ENCOUNTER — Ambulatory Visit
Admission: RE | Admit: 2020-11-03 | Discharge: 2020-11-03 | Disposition: A | Discharge status: Home / Self Care | Payer: 59 | Source: Ambulatory Visit | Attending: Family Medicine | Admitting: Family Medicine

## 2020-11-03 DIAGNOSIS — E049 Nontoxic goiter, unspecified: Secondary | ICD-10-CM

## 2020-11-04 NOTE — Addendum Note (Signed)
Addended by: [place], Shawnique Mariotti G on: 11/04/2020 04:44 PM   Modules accepted: Orders

## 2020-11-14 ENCOUNTER — Other Ambulatory Visit: Payer: 59

## 2020-11-20 ENCOUNTER — Inpatient Hospital Stay: Admission: RE | Admit: 2020-11-20 | Payer: 59 | Source: Ambulatory Visit

## 2020-11-29 ENCOUNTER — Other Ambulatory Visit: Payer: 59

## 2020-11-30 ENCOUNTER — Inpatient Hospital Stay: Admission: RE | Admit: 2020-11-30 | Payer: 59 | Source: Ambulatory Visit

## 2020-12-05 ENCOUNTER — Ambulatory Visit (INDEPENDENT_AMBULATORY_CARE_PROVIDER_SITE_OTHER)
Admission: RE | Admit: 2020-12-05 | Discharge: 2020-12-05 | Disposition: A | Discharge status: Home / Self Care | Payer: 59 | Source: Ambulatory Visit | Attending: Family Medicine | Admitting: Family Medicine

## 2020-12-05 ENCOUNTER — Other Ambulatory Visit: Payer: Self-pay

## 2020-12-05 DIAGNOSIS — R590 Localized enlarged lymph nodes: Secondary | ICD-10-CM

## 2020-12-05 MED ORDER — IOHEXOL 350 MG/ML SOLN
75.0000 mL | Freq: Once | INTRAVENOUS | Status: AC | PRN
  Administered 2020-12-05: 75 mL via INTRAVENOUS

## 2020-12-21 IMAGING — US US ABDOMEN LIMITED
1 series · 14 of 25 positions shown · non-contrast
Comparison: None.

CLINICAL DATA: Previously noted gallbladder polyp

EXAM:
ULTRASOUND ABDOMEN LIMITED RIGHT UPPER QUADRANT

[Series 1: us abdomen limited · 0.15mm/px · 14 of 45 slices shown]
[im 1/45]
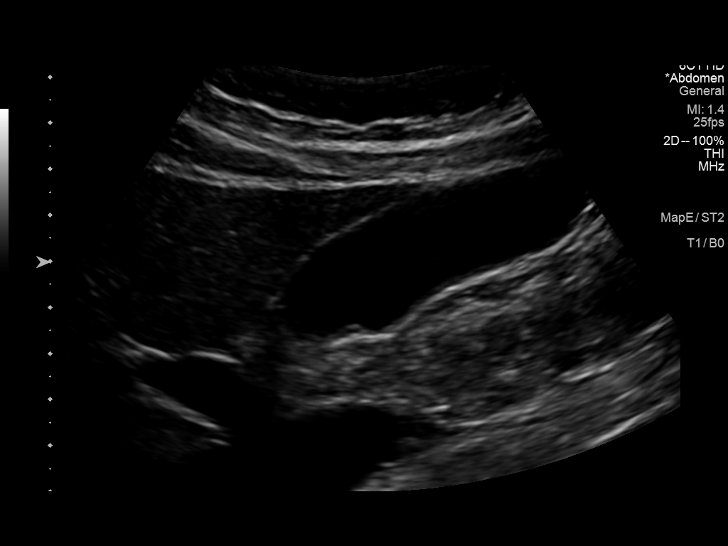
[im 4/45]
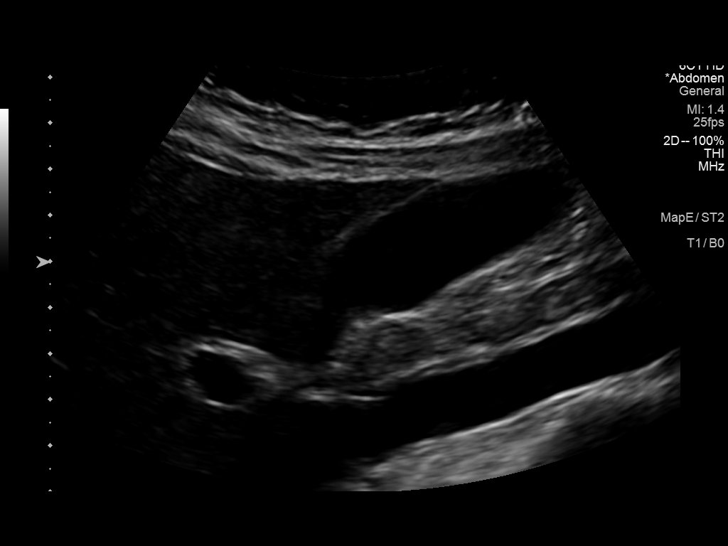
[im 8/45]
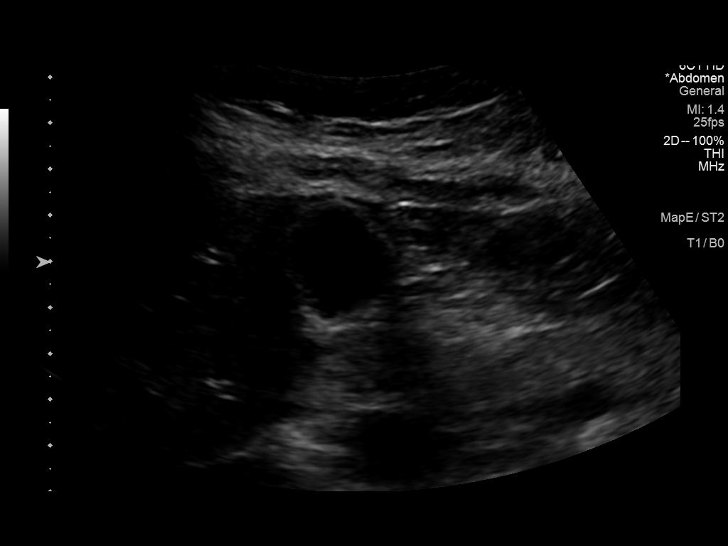
[im 12/45]
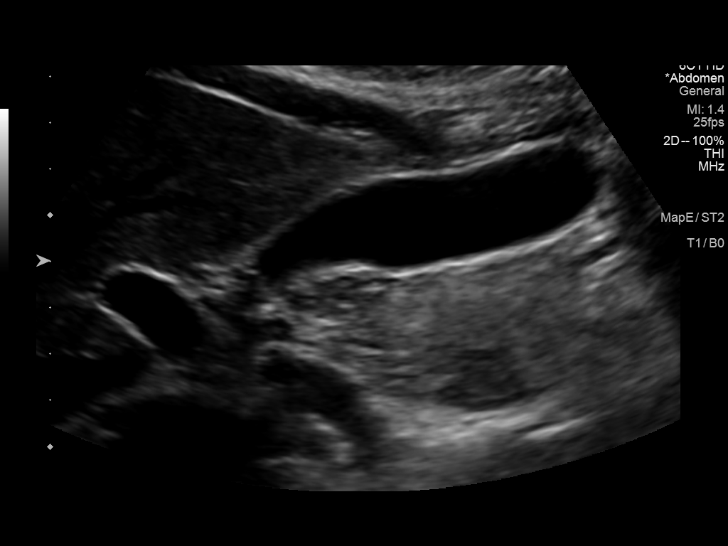
[im 15/45]
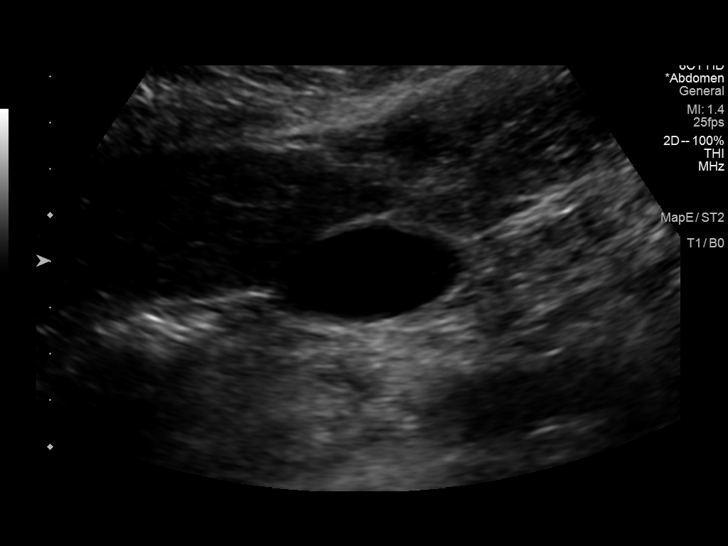
[im 17/45]
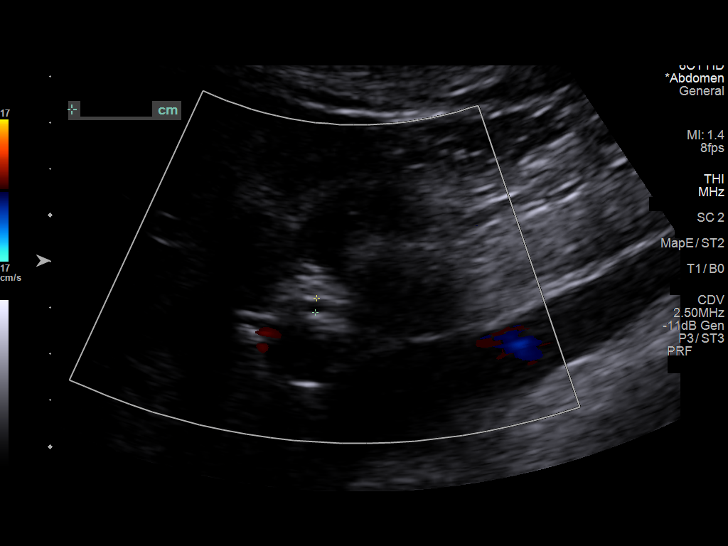
[im 21/45]
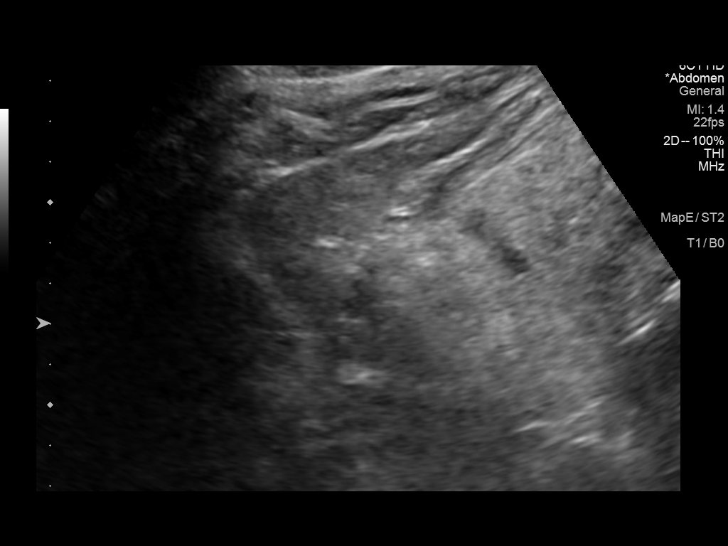
[im 24/45]
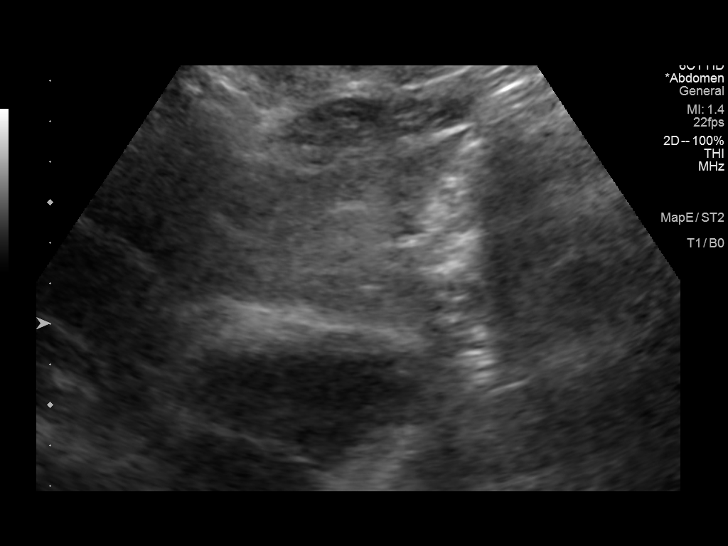
[im 28/45]
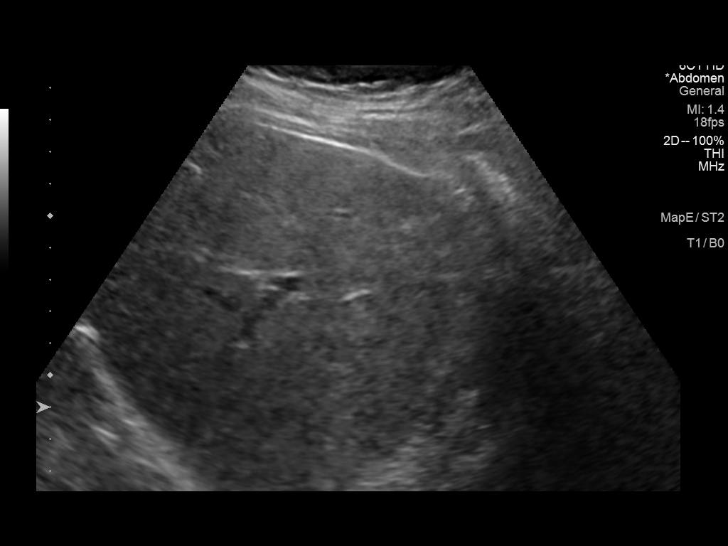
[im 30/45]
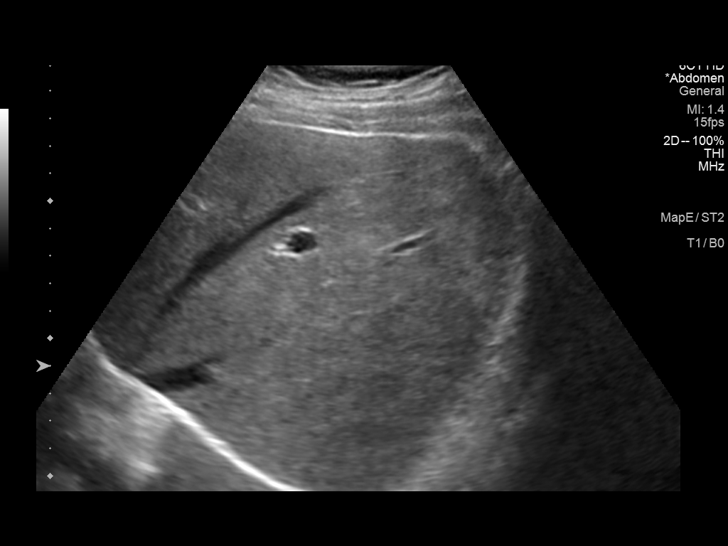
[im 34/45]
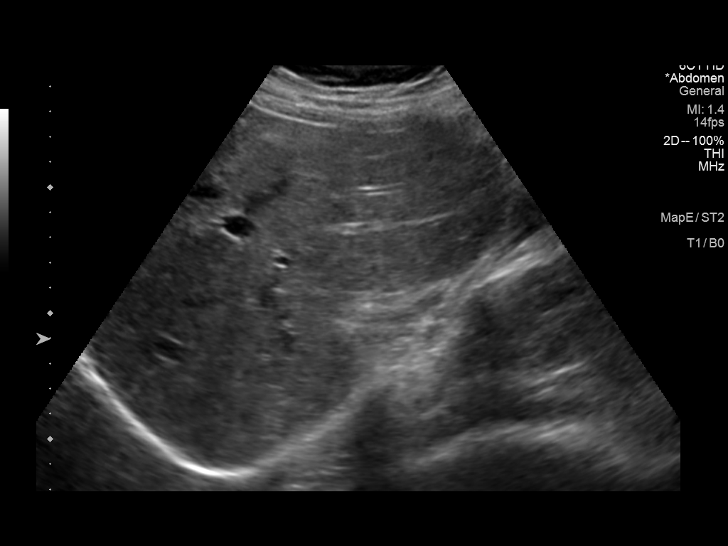
[im 37/45]
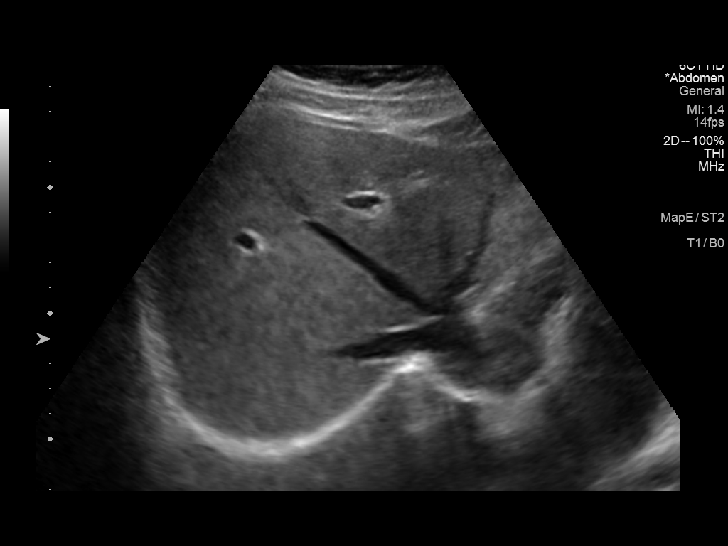
[im 41/45]
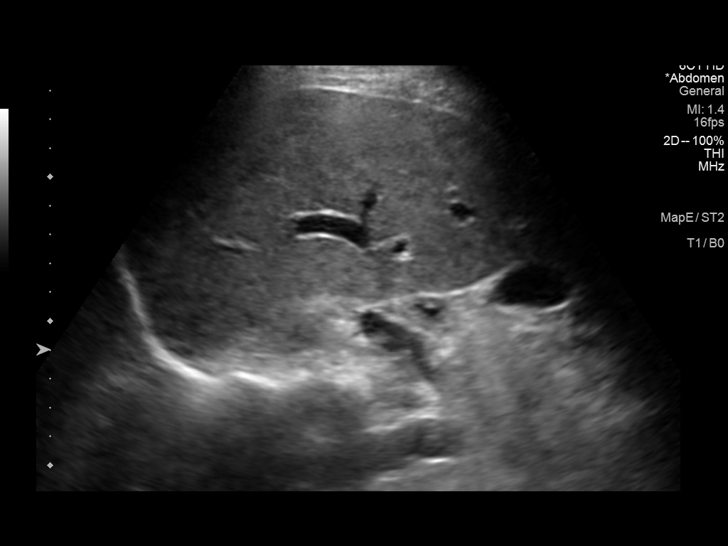
[im 45/45]
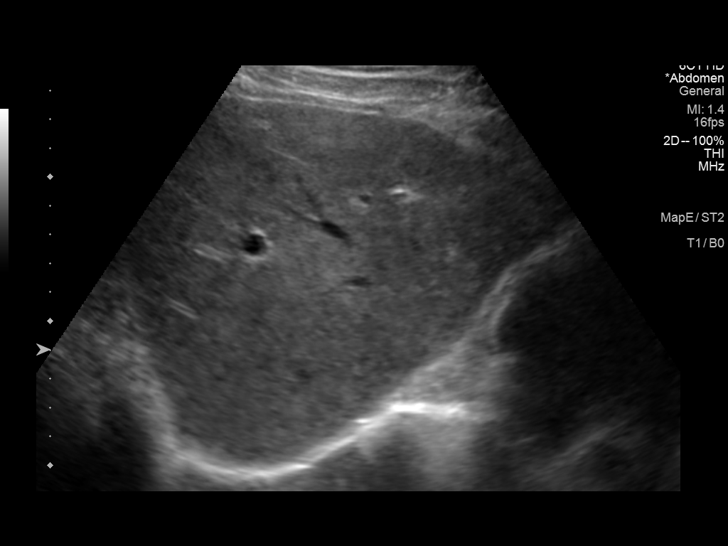

[14 of 25 positions shown; findings below may reference images not displayed]

FINDINGS: Gallbladder:

Within the gallbladder, there is a 4 mm echogenic focus which
neither moves nor shadows, a presumed small polyp. There are no
echogenic foci in the gallbladder which move and shadow as is
expected with cholelithiasis. No gallbladder wall thickening or
pericholecystic fluid. No sonographic Murphy sign noted by
sonographer.

Common bile duct:

Diameter: 3 mm period or extrahepatic biliary duct dilatation.

Liver:

No focal lesion identified. Within normal limits in parenchymal
echogenicity. Portal vein is patent on color Doppler imaging with
normal direction of blood flow towards the liver.

Other: None.
IMPRESSION: 4 mm apparent polyp within the gallbladder. Per consensus
guidelines, a polyp of this small size does not warrant additional
imaging surveillance. Gallbladder otherwise appears normal.

Study otherwise unremarkable.

## 2020-12-28 ENCOUNTER — Ambulatory Visit: Payer: 59 | Admitting: Internal Medicine

## 2021-01-08 ENCOUNTER — Ambulatory Visit: Payer: 59 | Admitting: Internal Medicine

## 2021-09-18 ENCOUNTER — Ambulatory Visit (INDEPENDENT_AMBULATORY_CARE_PROVIDER_SITE_OTHER): Payer: 59 | Admitting: Obstetrics and Gynecology

## 2021-09-18 ENCOUNTER — Encounter: Payer: Self-pay | Admitting: Obstetrics and Gynecology

## 2021-09-18 ENCOUNTER — Other Ambulatory Visit (HOSPITAL_COMMUNITY)
Admission: RE | Admit: 2021-09-18 | Discharge: 2021-09-18 | Disposition: A | Discharge status: Home / Self Care | Payer: 59 | Source: Ambulatory Visit | Attending: Obstetrics and Gynecology | Admitting: Obstetrics and Gynecology

## 2021-09-18 VITALS — BP 123/83 | HR 86 | Ht 65.0 in | Wt 164.0 lb

## 2021-09-18 DIAGNOSIS — Z01419 Encounter for gynecological examination (general) (routine) without abnormal findings: Secondary | ICD-10-CM | POA: Insufficient documentation

## 2021-09-18 NOTE — Progress Notes (Signed)
Subjective:     Cheryl Nichols is a 51 y.o. female P2 and is here for a comprehensive physical exam. The patient reports no problems. She is doing well reporting a monthly period lasing 6-7 days. She is sexually active without complaints. She denies pelvic pain or abnormal discharge. She denies urinary incontinence. She reports some occasional night sweats.  Past Medical History:  Diagnosis Date   Hepatitis    Past Surgical History:  Procedure Laterality Date   BREAST SURGERY     ceasarean     CESAREAN SECTION     Family History  Problem Relation Age of Onset   Breast cancer Neg Hx     Social History   Socioeconomic History   Marital status: Married    Spouse name: Not on file   Number of children: Not on file   Years of education: Not on file   Highest education level: Not on file  Occupational History   Not on file  Tobacco Use   Smoking status: Never   Smokeless tobacco: Never  Vaping Use   Vaping Use: Never used  Substance and Sexual Activity   Alcohol use: No   Drug use: No   Sexual activity: Yes    Partners: Male    Birth control/protection: Condom  Other Topics Concern   Not on file  Social History Narrative   Not on file   Social Determinants of Health   Financial Resource Strain: Not on file  Food Insecurity: Not on file  Transportation Needs: Not on file  Physical Activity: Not on file  Stress: Not on file  Social Connections: Not on file  Intimate Partner Violence: Not on file   Health Maintenance  Topic Date Due   COLONOSCOPY (Pts 45-25yrs Insurance coverage will need to be confirmed)  Never done   COVID-19 Vaccine (4 - Booster for Pfizer series) 03/29/2020   MAMMOGRAM  10/21/2020   PAP SMEAR-Modifier  09/21/2021   HIV Screening  12/20/2024 (Originally 06/09/1985)   INFLUENZA VACCINE  10/02/2021   TETANUS/TDAP  12/20/2029   Hepatitis C Screening  Completed   HPV VACCINES  Aged Out       Review of Systems Pertinent items noted in HPI  and remainder of comprehensive ROS otherwise negative.   Objective:  Blood pressure 123/83, pulse 86, height 5\' 5"  (1.651 m), weight 164 lb (74.4 kg).   GENERAL: Well-developed, well-nourished female in no acute distress.  HEENT: Normocephalic, atraumatic. Sclerae anicteric.  NECK: Supple. Normal thyroid.  LUNGS: Clear to auscultation bilaterally.  HEART: Regular rate and rhythm. BREASTS: Symmetric in size. No palpable masses or lymphadenopathy, skin changes, or nipple drainage. ABDOMEN: Soft, nontender, nondistended. No organomegaly. PELVIC: Normal external female genitalia. Vagina is pink and rugated.  Normal discharge. Normal appearing cervix. Uterus is normal in size. No adnexal mass or tenderness. Chaperone present during the pelvic exam EXTREMITIES: No cyanosis, clubbing, or edema, 2+ distal pulses.     Assessment:    Healthy female exam.      Plan:    Pap smear collected Screening mammogram ordered Patient referred to GI for colonoscopy Patient will be contacted with abnormal results See After Visit Summary for Counseling Recommendations

## 2021-09-18 NOTE — Progress Notes (Signed)
Patient presents for annual wellness exam. Has no complaints or concerns today. Last pap done 09/22/19. Results were normal. Denies any vaginal discharge, itching, burning, or odor. Would like STI testing done with exam.

## 2021-09-19 LAB — CERVICOVAGINAL ANCILLARY ONLY
Chlamydia: NEGATIVE
Comment: NEGATIVE
Comment: NEGATIVE
Comment: NORMAL
Neisseria Gonorrhea: NEGATIVE
Trichomonas: NEGATIVE

## 2021-09-20 LAB — CYTOLOGY - PAP
Comment: NEGATIVE
Diagnosis: NEGATIVE
High risk HPV: NEGATIVE

## 2021-10-10 ENCOUNTER — Ambulatory Visit
Admission: RE | Admit: 2021-10-10 | Discharge: 2021-10-10 | Disposition: A | Discharge status: Home / Self Care | Payer: 59 | Source: Ambulatory Visit | Attending: Obstetrics and Gynecology | Admitting: Obstetrics and Gynecology

## 2021-10-10 DIAGNOSIS — Z01419 Encounter for gynecological examination (general) (routine) without abnormal findings: Secondary | ICD-10-CM

## 2021-10-11 ENCOUNTER — Other Ambulatory Visit: Payer: Self-pay | Admitting: Obstetrics and Gynecology

## 2021-10-11 DIAGNOSIS — R928 Other abnormal and inconclusive findings on diagnostic imaging of breast: Secondary | ICD-10-CM

## 2021-10-23 ENCOUNTER — Ambulatory Visit
Admission: RE | Admit: 2021-10-23 | Discharge: 2021-10-23 | Disposition: A | Discharge status: Home / Self Care | Payer: 59 | Source: Ambulatory Visit | Attending: Obstetrics and Gynecology | Admitting: Obstetrics and Gynecology

## 2021-10-23 ENCOUNTER — Other Ambulatory Visit: Payer: Self-pay | Admitting: Obstetrics and Gynecology

## 2021-10-23 DIAGNOSIS — N6310 Unspecified lump in the right breast, unspecified quadrant: Secondary | ICD-10-CM

## 2021-10-23 DIAGNOSIS — R928 Other abnormal and inconclusive findings on diagnostic imaging of breast: Secondary | ICD-10-CM

## 2022-01-07 NOTE — Progress Notes (Unsigned)
    ACUTE VISIT No chief complaint on file.  HPI: [name] is a 51 y.o. female, who is here today complaining of *** HPI  Review of Systems Rest see pertinent positives and negatives per HPI.  Current Outpatient Medications on File Prior to Visit  Medication Sig Dispense Refill   tiZANidine (ZANAFLEX) 4 MG tablet Take 0.5-1 tablets (2-4 mg total) by mouth at bedtime as needed for muscle spasms. (Patient not taking: Reported on 09/18/2021) 30 tablet 1   No current facility-administered medications on file prior to visit.     Past Medical History:  Diagnosis Date   Hepatitis    No Known Allergies  Social History   Socioeconomic History   Marital status: Married    Spouse name: Not on file   Number of children: Not on file   Years of education: Not on file   Highest education level: Not on file  Occupational History   Not on file  Tobacco Use   Smoking status: Never   Smokeless tobacco: Never  Vaping Use   Vaping Use: Never used  Substance and Sexual Activity   Alcohol use: No   Drug use: No   Sexual activity: Yes    Partners: Male    Birth control/protection: Condom  Other Topics Concern   Not on file  Social History Narrative   Not on file   Social Determinants of Health   Financial Resource Strain: Not on file  Food Insecurity: Not on file  Transportation Needs: Not on file  Physical Activity: Not on file  Stress: Not on file  Social Connections: Not on file    There were no vitals filed for this visit. There is no height or weight on file to calculate BMI.  Physical Exam  ASSESSMENT AND PLAN:  There are no diagnoses linked to this encounter.   No follow-ups on file.   Joslyn Ramos G. [place], MD  [facility]. [place].  Discharge Instructions   None

## 2022-01-08 ENCOUNTER — Encounter: Payer: Self-pay | Admitting: Family Medicine

## 2022-01-08 ENCOUNTER — Ambulatory Visit: Payer: 59 | Admitting: Family Medicine

## 2022-01-08 VITALS — BP 104/60 | HR 81 | Temp 98.2°F | Resp 12 | Ht 65.0 in | Wt 169.4 lb

## 2022-01-08 DIAGNOSIS — E663 Overweight: Secondary | ICD-10-CM | POA: Diagnosis not present

## 2022-01-08 DIAGNOSIS — R600 Localized edema: Secondary | ICD-10-CM

## 2022-01-08 DIAGNOSIS — Z23 Encounter for immunization: Secondary | ICD-10-CM | POA: Diagnosis not present

## 2022-01-08 DIAGNOSIS — I8393 Asymptomatic varicose veins of bilateral lower extremities: Secondary | ICD-10-CM

## 2022-01-08 NOTE — Patient Instructions (Addendum)
A few things to remember from today's visit:  Bilateral lower extremity edema  Overweight (BMI 25.0-29.9)  Varicose veins of both lower extremities, unspecified whether complicated Medias de compresion y elevacion de las piernas para la hinchazon. No parece preocupante.  If you need refills for medications you take chronically, please call your pharmacy. Do not use My Chart to request refills or for acute issues that need immediate attention. If you send a my chart message, it may take a few days to be addressed, specially if I am not in the office.  Please be sure medication list is accurate. If a new problem present, please set up appointment sooner than planned today.  MyPlate Nurse, learning disability) del Curator from [employer] es un bosquejo de una alimentacin saludable general, basado en las Pautas alimentarias para [place] de 2020-2025, emitidas por el U.S. Department of Agriculture, [employer] (Departamento de Agricultura de EE. UU.). Establece pautas referidas a la cantidad de alimentos que deben consumirse de cada grupo, segn la edad, el sexo y [address] de [place] fsica. Consejos para seguir las pautas de MiPlato Para seguir las recomendaciones de MiPlato: Consuma una amplia variedad de frutas y verduras, cereales y alimentos proteicos. Srvase porciones ms pequeas y coma menos Administrator. Limite el tamao de las porciones para evitar comer de ms. Disfrute la comida. Haga actividad fsica durante por lo menos 150 minutos por semana. Esto significa unos 30 minutos por da, 5 o ms [employer]. Puede ser difcil que todas las comidas sean como las que se indican en MiPlato. Considere que MiPlato son pautas alimentarias para todo Medical laboratory scientific officer, Teacher, English as a foreign language de comidas individuales. Nils Pyle y verduras La mitad del plato debe tener frutas y verduras. Consuma frutas y verduras de muchos colores [employer]. Para un plan de alimentacin de 2000 caloras diarias, coma lo  siguiente: 2 tazas de verduras [employer]. 2 tazas de [employer]. 1 taza equivale a: 1 taza de verduras crudas o cocidas. 1 taza de frutas crudas. 1 naranja, manzana o banana de tamao mediano. 1 taza de jugo puro de frutas o verduras. 2 tazas de hojas verdes crudas, por ejemplo, lechuga, espinaca o col rizada.  taza de frutas disecadas. Cereales Un cuarto del plato debe consistir en cereales. Al menos la mitad de los cereales que ingiera al da deben ser cereales integrales. Para un plan de alimentacin de 2000 caloras diarias, coma 6 onzas (170 gramos) de cereales [employer]. Una onza (28 gramos) equivale a: 1 rebanada de pan. 1 taza de cereales.  taza de arroz cocido, cereales o pastas. Protenas Un cuarto del plato debe consistir en protenas. Consuma una amplia variedad de alimentos proteicos, por ejemplo, carne roja, pollo, pescado, huevos, habas, frutos secos y tofu. Para un plan de alimentacin de 2000 caloras diarias, coma 5 onzas (160 gramos) de [employer]. Una onza (28 gramos) equivale a: 1 onza (30 gramos) de carne de res, ave o pescado.  de taza de porotos cocidos. 1 huevo.  onza (14 gramos) de frutos secos o semillas. 1 cucharada de [place] de man. Lcteos Consuma leche descremada o con bajo contenido graso (1 %). Consuma lcteos para acompaar las comidas. Para un plan de alimentacin de 2000 caloras por da, consuma 3 tazas de lcteos diariamente. 1 taza equivale a: 1 taza de [place], [address], requesn o [place] de soja (bebida a base de soja). 2 onzas (56 gramos) de queso procesado. 1 onza (42  gramos) de queso natural. Grasas, aceites, sal y azcares Solo se recomiendan pequeas cantidades de aceites. Evite los alimentos ricos en caloras y con Estate manager/land agent nutricional (caloras vacas), como aquellos con alto contenido de grasas o Dispensing optician. Opte por alimentos con bajo contenido de sal (sodio). Elija alimentos que  contengan menos de 140 miligramos (mg) de sodio por porcin. Beba agua en lugar de bebidas azucaradas. Beba suficiente lquido como para Theatre manager la orina de color amarillo plido. Dnde buscar apoyo Trabaje junto con el mdico o el nutricionista a fin de Tax adviser de alimentacin personalizado que sea adecuado para usted. Descargue una aplicacin mvil para controlar su consumo diario de alimentos. Dnde obtener ms informacin USDA (Departamento de Agricultura de EE. UU.): [email or web address] Resumen MiPlato es una pauta general de alimentacin saludable, ofrecida por Programmer, multimedia. Se basa en las Pautas alimentarias para estadounidenses de 2020-2025. En general, la mitad del plato debe ser de frutas y verduras, un cuarto del plato debe ser de cereales y el otro cuarto debe ser de protenas. Esta informacin no tiene Marine scientist el consejo del mdico. Asegrese de hacerle al mdico cualquier pregunta que tenga. Document Revised: 01/25/2020 Document Reviewed: 01/25/2020 Elsevier Patient Education  [place].

## 2022-04-26 ENCOUNTER — Other Ambulatory Visit: Payer: 59

## 2022-06-17 ENCOUNTER — Ambulatory Visit
Admission: RE | Admit: 2022-06-17 | Discharge: 2022-06-17 | Disposition: A | Discharge status: Home / Self Care | Payer: 59 | Source: Ambulatory Visit | Attending: Obstetrics and Gynecology | Admitting: Obstetrics and Gynecology

## 2022-06-17 ENCOUNTER — Other Ambulatory Visit: Payer: Self-pay | Admitting: Obstetrics and Gynecology

## 2022-06-17 DIAGNOSIS — N6310 Unspecified lump in the right breast, unspecified quadrant: Secondary | ICD-10-CM

## 2022-11-05 ENCOUNTER — Ambulatory Visit: Payer: 59 | Admitting: Adult Health

## 2022-11-05 ENCOUNTER — Encounter: Payer: Self-pay | Admitting: Adult Health

## 2022-11-05 VITALS — BP 110/60 | HR 85 | Temp 97.6°F | Ht 65.0 in | Wt 169.0 lb

## 2022-11-05 DIAGNOSIS — N611 Abscess of the breast and nipple: Secondary | ICD-10-CM

## 2022-11-05 MED ORDER — DOXYCYCLINE HYCLATE 100 MG PO CAPS
100.0000 mg | ORAL_CAPSULE | Freq: Two times a day (BID) | ORAL | 0 refills | Status: DC
Start: 2022-11-05 — End: 2022-11-18

## 2022-11-05 NOTE — Progress Notes (Signed)
Subjective:    Patient ID: Cheryl Nichols, female    DOB: Jun 19, 1970, 52 y.o.   MRN: [id number]  HPI 52 year old female who  has a past medical history of Hepatitis.  She is a patient of Dr. [place] who I am seeing today for concern of a mass on her left breast.  She reports that roughly a month ago she noticed a "pimple" on the outside of her left breast.  Over the weekend she noticed that this was getting larger in size and was becoming slightly painful.  She does not notice any drainage or discharge.  She has not had any fevers or chills.    Review of Systems See HPI   Past Medical History:  Diagnosis Date   Hepatitis     Social History   Socioeconomic History   Marital status: Married    Spouse name: Not on file   Number of children: Not on file   Years of education: Not on file   Highest education level: Not on file  Occupational History   Not on file  Tobacco Use   Smoking status: Never   Smokeless tobacco: Never  Vaping Use   Vaping status: Never Used  Substance and Sexual Activity   Alcohol use: No   Drug use: No   Sexual activity: Yes    Partners: Male    Birth control/protection: Condom  Other Topics Concern   Not on file  Social History Narrative   Not on file   Social Determinants of Health   Financial Resource Strain: Not on file  Food Insecurity: Not on file  Transportation Needs: Not on file  Physical Activity: Not on file  Stress: Not on file  Social Connections: Not on file  Intimate Partner Violence: Not on file    Past Surgical History:  Procedure Laterality Date   BREAST SURGERY Left    pt states ex bx 2002-2003-benign   ceasarean     CESAREAN SECTION      Family History  Problem Relation Age of Onset   Breast cancer Neg Hx     No Known Allergies  No current outpatient medications on file prior to visit.   No current facility-administered medications on file prior to visit.    BP 110/60   Pulse 85   Temp 97.6 F (36.4  C) (Oral)   Ht 5\' 5"  (1.651 m)   Wt 169 lb (76.7 kg)   LMP 10/13/2022   SpO2 98%   BMI 28.12 kg/m       Objective:   Physical Exam Vitals and nursing note reviewed.  Constitutional:      Appearance: Normal appearance.  Cardiovascular:     Rate and Rhythm: Normal rate.  Musculoskeletal:        General: Normal range of motion.  Skin:    General: Skin is warm and dry.     Comments: He has about a nickel sized nonfluctuant abscess on the outside of her left breast.  slightly tender to palpation  Neurological:     General: No focal deficit present.     Mental Status: She is alert and oriented to person, place, and time.  Psychiatric:        Mood and Affect: Mood normal.        Behavior: Behavior normal.        Thought Content: Thought content normal.        Judgment: Judgment normal.       Assessment &  Plan:  1. Abscess of left breast -Uses abscess.  Will start her on doxycycline 100 mg twice daily.  Advised warm compress to abscess throughout the day.  Follow-up if drainage starts. - doxycycline (VIBRAMYCIN) 100 MG capsule; Take 1 capsule (100 mg total) by mouth 2 (two) times daily.  Dispense: 14 capsule; Refill: 0  Shirline Frees, NP

## 2022-11-05 NOTE — Patient Instructions (Signed)
Health Maintenance Due  Topic Date Due   Zoster Vaccines- Shingrix (1 of 2) Never done   Colonoscopy  Never done   INFLUENZA VACCINE  10/03/2022   MAMMOGRAM  10/11/2022   COVID-19 Vaccine (4 - 2023-24 season) 11/03/2022       01/08/2022    2:05 PM 09/18/2021    8:57 AM 12/21/2019    9:00 AM  Depression screen PHQ 2/9  Decreased Interest 0 0 0  Down, Depressed, Hopeless 0 0 0  PHQ - 2 Score 0 0 0  Altered sleeping  0   Tired, decreased energy  0   Change in appetite  0   Feeling bad or failure about yourself   0   Trouble concentrating  0   Moving slowly or fidgety/restless  0   Suicidal thoughts  0   PHQ-9 Score  0

## 2022-11-06 ENCOUNTER — Ambulatory Visit: Payer: 59 | Admitting: Family Medicine

## 2022-11-18 ENCOUNTER — Encounter: Payer: Self-pay | Admitting: Obstetrics and Gynecology

## 2022-11-18 ENCOUNTER — Ambulatory Visit: Payer: 59 | Admitting: Obstetrics and Gynecology

## 2022-11-18 DIAGNOSIS — N611 Abscess of the breast and nipple: Secondary | ICD-10-CM | POA: Diagnosis not present

## 2022-11-18 MED ORDER — DOXYCYCLINE HYCLATE 100 MG PO CAPS: 100.0000 mg | ORAL_CAPSULE | Freq: Two times a day (BID) | ORAL | 0 refills | Status: DC

## 2022-11-18 NOTE — Progress Notes (Signed)
52 yo presenting today for the evaluation of left breast abscess. Patient reports being diagnosed with a left breast abscess 2 weeks ago and treated with antibiotics. She reports significant decrease in size of the abscess without spontaneous drainage and complete resolution of pain. She returns today because she noticed that the area has increased in size again and remains non tender. The size of the abscess remains smaller than what it was originally.  Past Medical History:  Diagnosis Date   Hepatitis    Past Surgical History:  Procedure Laterality Date   BREAST SURGERY Left    pt states ex bx 2002-2003-benign   ceasarean     CESAREAN SECTION     Family History  Problem Relation Age of Onset   Breast cancer Neg Hx    Social History   Tobacco Use   Smoking status: Never   Smokeless tobacco: Never  Vaping Use   Vaping status: Never Used  Substance Use Topics   Alcohol use: No   Drug use: No   ROS See pertinent in HPI. All other systems reviewed and non contributory Blood pressure 101/68, pulse 76, weight 170 lb (77.1 kg), last menstrual period 11/05/2022. GENERAL: Well-developed, well-nourished female in no acute distress.  BREASTS: Symmetric in size. No palpable masses or lymphadenopathy, skin changes, or nipple drainage. 1 cm well circumscribed area on the inferior lateral aspect of the left breast near bra line, with associated skin erythema. Non fluctuant, non tender EXTREMITIES: No cyanosis, clubbing, or edema, 2+ distal pulses.  A/P 52 yo with left breast abscess - Area not ready for I&D - Rx antibiotic provided - Advised patient to change bra or not wear underwire to allow area to heal - Advised patient to apply warm compresses to the area - RTC if size increases - Patient scheduled for diagnostic right breast ultrasound previously

## 2022-11-18 NOTE — Progress Notes (Signed)
Pt complains of pimple like area on left breast x 2 months, here for eval.  Pt states she has had abx in past for this problem.

## 2022-11-22 IMAGING — CT CT NECK W/ CM
4 of 5 series · 14 of 33 positions shown, 16 images · IV contrast (OMNIPAQUE 300)
Comparison: Thyroid ultrasound 11/03/2020.  Neck CT 03/08/2017.

CLINICAL DATA: 50-year-old female with enlarged left cervical lymph
node seen on thyroid ultrasound last month. Subsequent encounter.

EXAM:
CT NECK WITH CONTRAST
TECHNIQUE: Multidetector CT imaging of the neck was performed using the
standard protocol following the bolus administration of intravenous
contrast.
CONTRAST:  75mL OMNIPAQUE IOHEXOL 350 MG/ML SOLN

[Series 5: bone · axial · 0.53mm/px · z∈[-223,-111]mm · 3 of 113 slices shown, 4 images]
[im 29/113  soft-tissue]
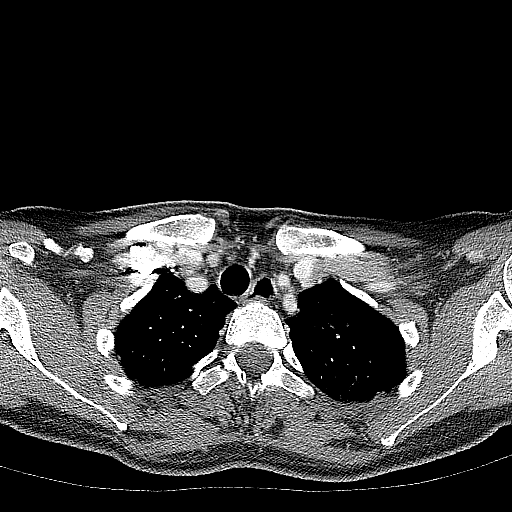
[im 29/113  bone]
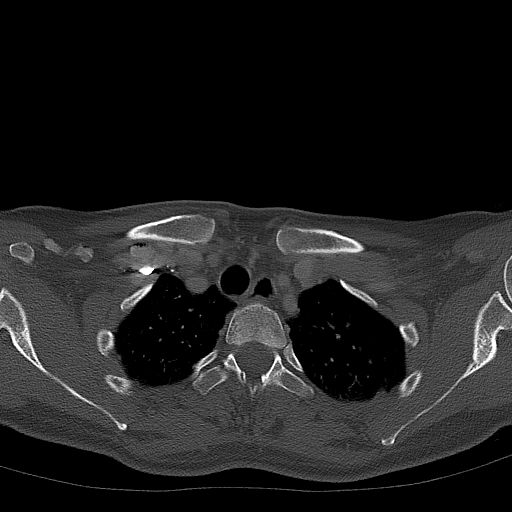
[im 57/113  bone]
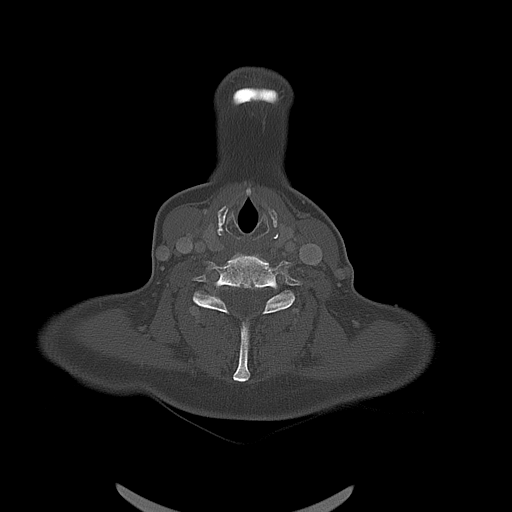
[im 85/113  bone]
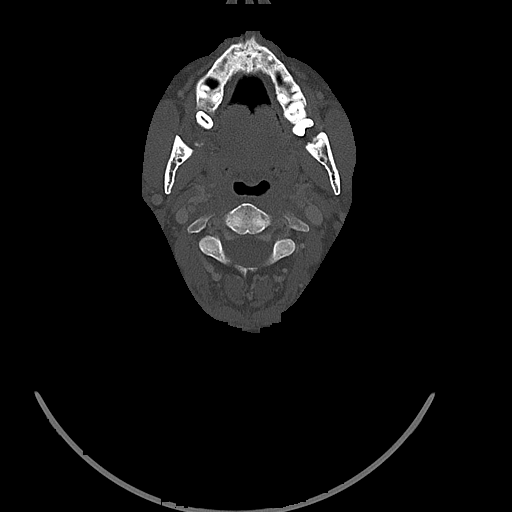

[Series 6: coronal st · coronal · 0.44mm/px · 3 of 126 slices shown]
[im 26/126  bone]
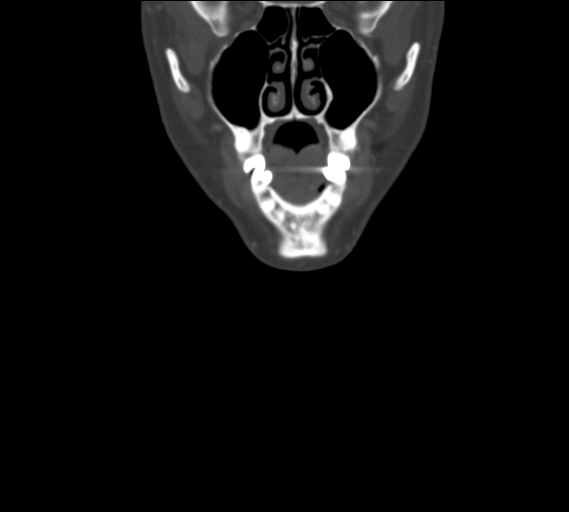
[im 51/126  bone]
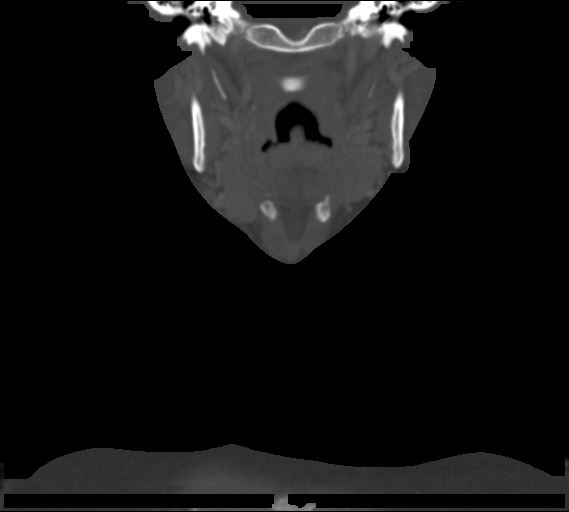
[im 76/126  bone]
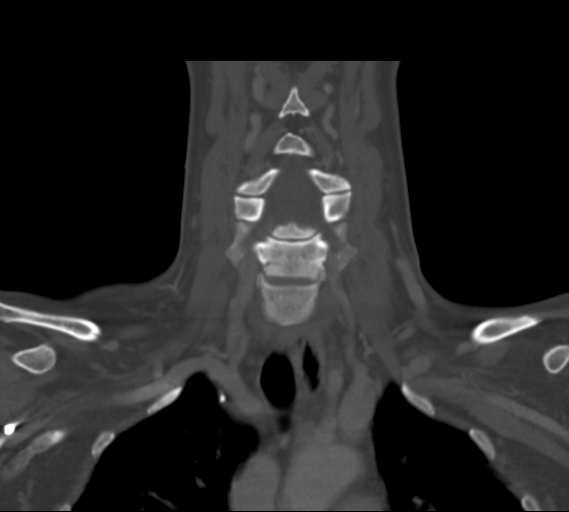

[Series 7: sagittal st · sagittal · 0.44mm/px · 5 of 101 slices shown, 6 images]
[im 34/101  bone]
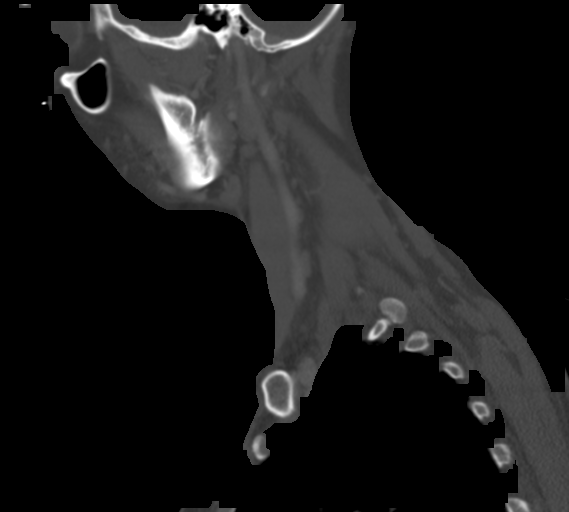
[im 42/101  bone]
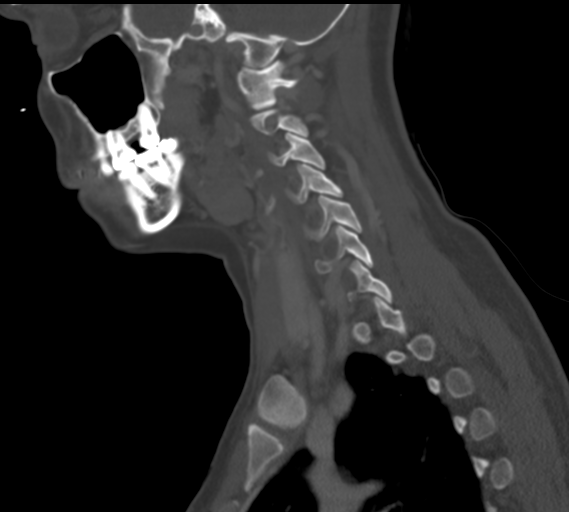
[im 51/101  soft-tissue]
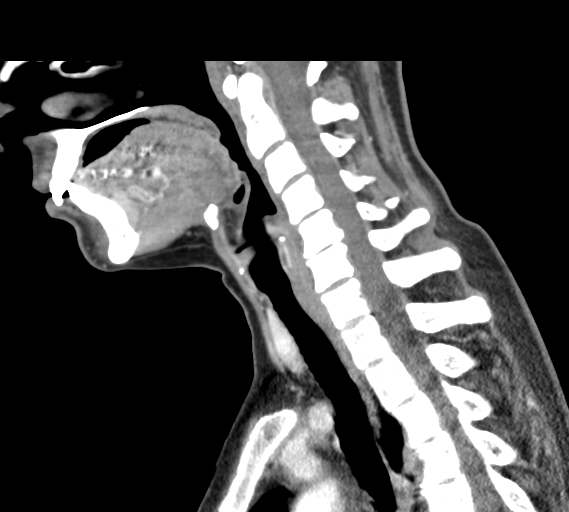
[im 51/101  bone]
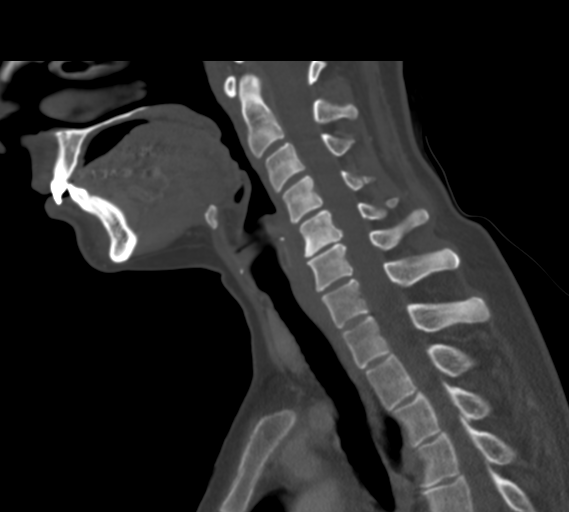
[im 59/101  bone]
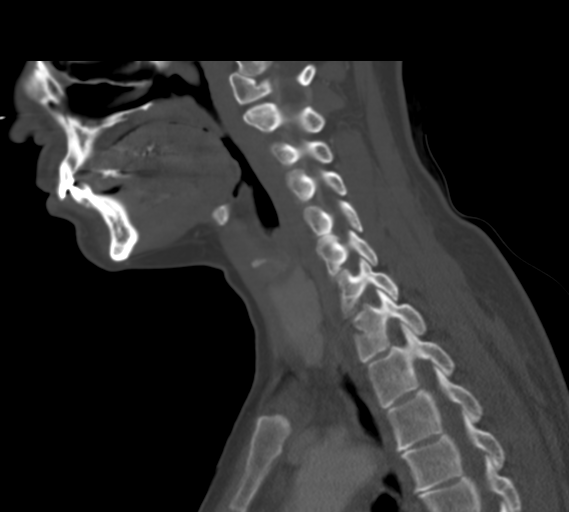
[im 67/101  bone]
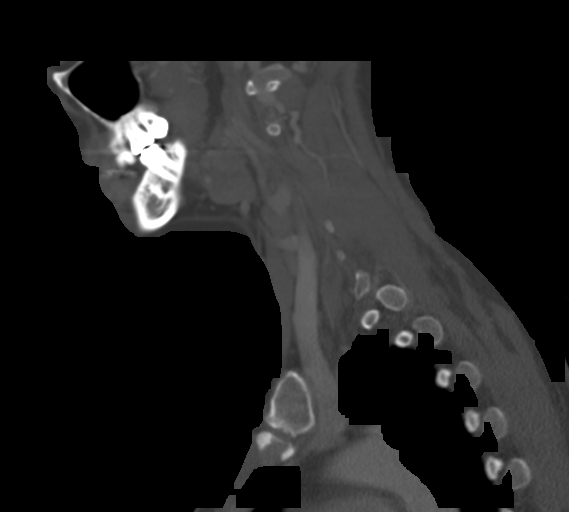

[Series 8: orthogonal · axial · 0.39mm/px · z∈[-255,-143]mm · 3 of 119 slices shown]
[im 30/119  bone]
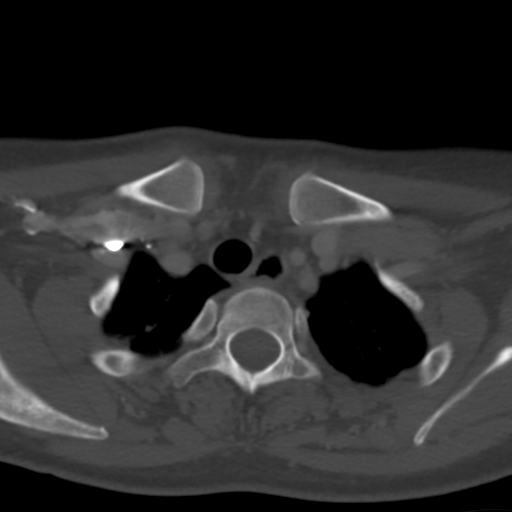
[im 60/119  bone]
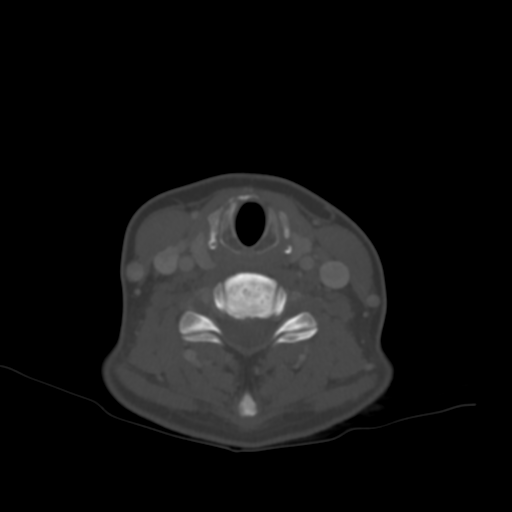
[im 89/119  bone]
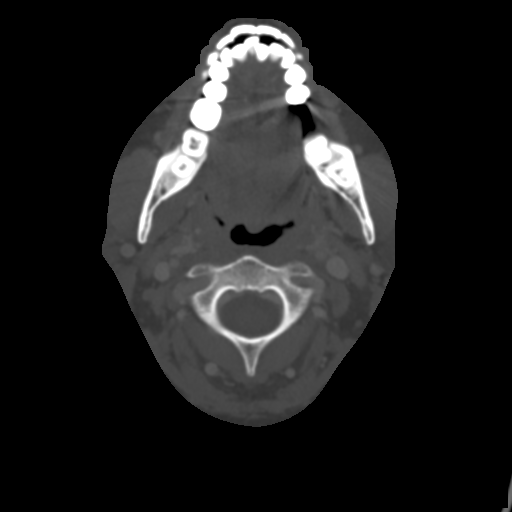

[14 of 33 positions shown; findings below may reference images not displayed]

FINDINGS: Pharynx and larynx: Larynx and pharynx soft tissues appears stable
since 2926 and within normal limits. Negative parapharyngeal and
retropharyngeal spaces.

Salivary glands: Resolved left submandibular space inflammation seen
in 2926, and the left submandibular gland now appears normal aside
from a medial 2 mm sialolith on series 3, image 36. Sublingual
space, right submandibular gland, and bilateral parotid glands are
stable and within normal limits.

Thyroid: Stable since 2926, better characterized on the recent
ultrasound.

Lymph nodes: No level 4 lymphadenopathy. Bilateral lymph nodes in
the lower neck and at the thoracic inlet appear stable since 2926
and normal. At the left level IIIa station there is an asymmetric
lymph node measuring 5-6 mm short axis (series 3, image 46 and
coronal image 62, which does resemble that on the ultrasound.
However, this node was larger in 2926, 10 mm, appearing reactive
related to the nearby submandibular inflammation at that time.

Bilateral level 2 lymph nodes are within normal limits, slightly
larger on the right measuring up to 8 mm short axis there. No
heterogeneous or suspicious lymph node identified.

Vascular: Major vascular structures in the neck and at the skull
base appear to be patent. The right vertebral artery appears
dominant, normal variant.

Limited intracranial: Negative.

Visualized orbits: Negative.

Mastoids and visualized paranasal sinuses: Clear.

Skeleton: C5-C6 mild disc and endplate degeneration. No acute or
suspicious osseous lesion.

Upper chest: Negative.
IMPRESSION: 1. Asymmetric but physiologic appearing left level IIIa lymph node
likely corresponds to that seen by Ultrasound last month, and is
smaller than on a 2926 CT when there was nearby inflammation of the
left submandibular gland and submandibular space - now resolved.

2. Otherwise negative CT appearance of the Neck.

## 2022-12-24 ENCOUNTER — Ambulatory Visit: Payer: 59 | Admitting: Obstetrics and Gynecology

## 2023-01-03 ENCOUNTER — Other Ambulatory Visit: Payer: 59

## 2023-01-06 ENCOUNTER — Ambulatory Visit: Payer: 59 | Admitting: Obstetrics and Gynecology

## 2023-01-06 ENCOUNTER — Encounter: Payer: Self-pay | Admitting: Obstetrics and Gynecology

## 2023-01-06 VITALS — BP 113/78 | HR 79 | Ht 66.0 in | Wt 166.8 lb

## 2023-01-06 DIAGNOSIS — Z1211 Encounter for screening for malignant neoplasm of colon: Secondary | ICD-10-CM | POA: Diagnosis not present

## 2023-01-06 DIAGNOSIS — Z09 Encounter for follow-up examination after completed treatment for conditions other than malignant neoplasm: Secondary | ICD-10-CM | POA: Diagnosis not present

## 2023-01-06 NOTE — Progress Notes (Signed)
52 yo P2 returning as a follow up for breast abscess. Patient was last treated in September 2024 with antibiotic for a 1 cm breast abscess located in the bra line area. Patient reports spontaneous resolution of the abscess without drainage. She is without any other complaints  Past Medical History:  Diagnosis Date   Hepatitis    Past Surgical History:  Procedure Laterality Date   BREAST SURGERY Left    pt states ex bx 2002-2003-benign   ceasarean     CESAREAN SECTION     Family History  Problem Relation Age of Onset   Breast cancer Neg Hx    Social History   Tobacco Use   Smoking status: Never   Smokeless tobacco: Never  Vaping Use   Vaping status: Never Used  Substance Use Topics   Alcohol use: No   Drug use: No   ROS See pertinent in HPI. All other systems reviewed and non contributory Blood pressure 113/78, pulse 79, height 5\' 6"  (1.676 m), weight 166 lb 12.8 oz (75.7 kg), last menstrual period 12/28/2022. GENERAL: Well-developed, well-nourished female in no acute distress.  BREASTS: Symmetric in size. No palpable masses or lymphadenopathy, skin changes, or nipple drainage. Area of previous boil completely healed EXTREMITIES: No cyanosis, clubbing, or edema, 2+ distal pulses.  A/P 52 yo P2 here for follow up on breast abscess - Abscess completely resolved - Patient advised to wear appropriate bra, avoiding under wire as to minimize skin irritation.  - Patient referred for colonoscopy

## 2023-01-07 ENCOUNTER — Ambulatory Visit
Admission: RE | Admit: 2023-01-07 | Discharge: 2023-01-07 | Disposition: A | Discharge status: Home / Self Care | Payer: 59 | Source: Ambulatory Visit | Attending: Obstetrics and Gynecology | Admitting: Obstetrics and Gynecology

## 2023-01-07 DIAGNOSIS — N6310 Unspecified lump in the right breast, unspecified quadrant: Secondary | ICD-10-CM

## 2023-07-22 ENCOUNTER — Ambulatory Visit (INDEPENDENT_AMBULATORY_CARE_PROVIDER_SITE_OTHER): Admitting: Family Medicine

## 2023-07-22 ENCOUNTER — Ambulatory Visit: Attending: Family Medicine

## 2023-07-22 ENCOUNTER — Encounter: Payer: Self-pay | Admitting: Family Medicine

## 2023-07-22 VITALS — BP 120/70 | HR 91 | Resp 12 | Ht 66.0 in | Wt 172.1 lb

## 2023-07-22 DIAGNOSIS — Z0000 Encounter for general adult medical examination without abnormal findings: Secondary | ICD-10-CM

## 2023-07-22 DIAGNOSIS — R002 Palpitations: Secondary | ICD-10-CM | POA: Diagnosis not present

## 2023-07-22 DIAGNOSIS — Z130 Encounter for screening for diseases of the blood and blood-forming organs and certain disorders involving the immune mechanism: Secondary | ICD-10-CM | POA: Diagnosis not present

## 2023-07-22 DIAGNOSIS — Z13220 Encounter for screening for lipoid disorders: Secondary | ICD-10-CM

## 2023-07-22 DIAGNOSIS — Z13228 Encounter for screening for other metabolic disorders: Secondary | ICD-10-CM | POA: Diagnosis not present

## 2023-07-22 DIAGNOSIS — Z1329 Encounter for screening for other suspected endocrine disorder: Secondary | ICD-10-CM

## 2023-07-22 LAB — CBC
HCT: 38.4 % (ref 36.0–46.0)
Hemoglobin: 12.7 g/dL (ref 12.0–15.0)
MCHC: 33.2 g/dL (ref 30.0–36.0)
MCV: 87 fl (ref 78.0–100.0)
Platelets: 329 10*3/uL (ref 150.0–400.0)
RBC: 4.42 Mil/uL (ref 3.87–5.11)
RDW: 13.9 % (ref 11.5–15.5)
WBC: 7.6 10*3/uL (ref 4.0–10.5)

## 2023-07-22 LAB — TSH: TSH: 0.87 u[IU]/mL (ref 0.35–5.50)

## 2023-07-22 NOTE — Progress Notes (Unsigned)
 EP to read.

## 2023-07-22 NOTE — Assessment & Plan Note (Signed)
 We discussed the importance of regular physical activity and healthy diet for prevention of chronic illness and/or complications. Preventive guidelines reviewed. Vaccination: She prefers to hold on Shingrix and Prevnar 20 for now. Declined colon cancer screening/colonoscopy, she will let me know if she decides to do it. Continue her female preventive care with gynecologist. Next CPE in a year.

## 2023-07-22 NOTE — Patient Instructions (Addendum)
 A few things to remember from today's visit:  Routine general medical examination at a health care facility  Colon cancer screening  Screening for lipoid disorders - Plan: Lipid panel  Screening for endocrine, metabolic and immunity disorder - Plan: Comprehensive metabolic panel with GFR  Palpitations - Plan: LONG TERM MONITOR (3-14 DAYS), CBC, TSH  Mantenimiento de la salud en las mujeres Health Maintenance, Female Adoptar un estilo de vida saludable y recibir atencin preventiva son importantes para promover la salud y Counsellor. Consulte al mdico sobre: El esquema adecuado para hacerse pruebas y exmenes peridicos. Cosas que puede hacer por su cuenta para prevenir enfermedades y [place] sano. Qu debo saber sobre la dieta, el peso y el ejercicio? Consuma una dieta saludable  Consuma una dieta que incluya muchas verduras, frutas, productos lcteos con bajo contenido de grasa y protenas magras. No consuma muchos alimentos ricos en grasas slidas, azcares agregados o sodio. Mantenga un peso saludable El ndice de masa muscular [facility]) se [place] para identificar problemas de [place]. Proporciona una estimacin de la grasa corporal basndose en el peso y la altura. Su mdico puede ayudarle a determinar su IMC y a Personnel officer o Pharmacologist un peso saludable. Haga ejercicio con regularidad Haga ejercicio con regularidad. Esta es una de las prcticas ms importantes que puede hacer por su salud. La [employer] de los adultos deben seguir estas pautas: Education officer, environmental, al menos, 150 minutos de actividad fsica por semana. El ejercicio debe aumentar la frecuencia cardaca y Media planner transpirar (ejercicio de intensidad moderada). Hacer ejercicios de fortalecimiento por lo [employer] por semana. Agregue esto a su plan de ejercicio de intensidad moderada. Pase menos tiempo sentada. Incluso la actividad fsica ligera puede ser beneficiosa. Controle sus niveles de colesterol y lpidos en la sangre Comience  a realizarse anlisis de lpidos y Oncologist en la sangre a los 20 aos y luego reptalos cada 5 aos. Hgase controlar los niveles de colesterol con mayor frecuencia si: Sus niveles de lpidos y colesterol son altos. Es mayor de 40 aos. Presenta un alto riesgo de padecer enfermedades cardacas. Qu debo saber sobre las pruebas de deteccin del cncer? Segn su historia clnica y sus antecedentes familiares, es posible que deba realizarse pruebas de deteccin del cncer en diferentes edades. Esto puede incluir pruebas de deteccin de lo siguiente: Cncer de mama. Cncer de cuello uterino. Cncer colorrectal. Cncer de piel. Cncer de pulmn. Qu debo saber sobre la enfermedad cardaca, la diabetes y la hipertensin arterial? Presin arterial y enfermedad cardaca La hipertensin arterial causa enfermedades cardacas y [place] el riesgo de accidente cerebrovascular. Es ms probable que esto se manifieste en las personas que tienen lecturas de presin arterial alta o tienen sobrepeso. Hgase controlar la presin arterial: Cada 3 a 5 aos si tiene entre 18 y 53 aos. Todos los aos si es mayor de 40 aos. Diabetes Realcese exmenes de deteccin de la diabetes con regularidad. Este anlisis revisa el nivel de azcar en la sangre en [place]. Hgase las pruebas de deteccin: Cada tres aos despus de los 40 aos de edad si tiene un peso normal y un bajo riesgo de padecer diabetes. Con ms frecuencia y a partir de [place] edad inferior si tiene sobrepeso o un alto riesgo de padecer diabetes. Qu debo saber sobre la prevencin de infecciones? Hepatitis B Si tiene un riesgo ms alto de contraer hepatitis B, debe someterse a un examen de deteccin de este virus. Hable con el mdico para averiguar si tiene riesgo de [employer]  infeccin por hepatitis B. Hepatitis C Se recomienda el anlisis a: [employer] 1945 y 1965. Todas las personas que tengan un riesgo de haber contrado  hepatitis C. Enfermedades de transmisin sexual (ETS) Hgase las pruebas de Airline pilot de ITS, incluidas la gonorrea y la clamidia, si: Es sexualmente activa y es menor de [address]. Es mayor de [address], y Public affairs consultant informa que corre riesgo de tener este tipo de infecciones. La actividad sexual ha cambiado desde que le hicieron la ltima prueba de deteccin y tiene un riesgo mayor de tener clamidia o Copy. Pregntele al mdico si usted tiene riesgo. Pregntele al mdico si usted tiene un alto riesgo de Primary school teacher VIH. El mdico tambin puede recomendarle un medicamento recetado para ayudar a evitar la infeccin por el VIH. Si elige tomar medicamentos para prevenir el VIH, primero debe [employer] de deteccin del VIH. Luego debe hacerse anlisis cada 3 meses mientras est tomando los medicamentos. Embarazo Si est por dejar de menstruar (fase premenopusica) y usted puede quedar embarazada, busque asesoramiento antes de quedar embarazada. Tome de 400 a 800 microgramos (mcg) de cido flico todos los das si queda embarazada. Pida mtodos de control de la natalidad (anticonceptivos) si desea evitar un embarazo no deseado. Osteoporosis y [place] La osteoporosis es una enfermedad en la que los huesos pierden los minerales y la fuerza por el avance de la edad. El resultado pueden ser fracturas en los [place]. Si tiene 65 aos o ms, o si est en riesgo de sufrir osteoporosis y fracturas, pregunte a su mdico si debe: Hacerse pruebas de deteccin de prdida sea. Tomar un suplemento de calcio o de vitamina D para reducir el riesgo de fracturas. Recibir terapia de reemplazo hormonal (TRH) para tratar los sntomas de la menopausia. Siga estas indicaciones en su casa: Consumo de alcohol No beba alcohol si: Su mdico le indica no hacerlo. Est embarazada, puede estar embarazada o est tratando de quedar embarazada. Si bebe alcohol: Limite la cantidad que bebe a lo siguiente: De 0 a 1  bebida por da. Sepa cunta cantidad de alcohol hay en las bebidas que toma. En los [address], una medida equivale a una botella de cerveza de 12 oz (355 ml), un vaso de vino de 5 oz (148 ml) o un vaso de una bebida alcohlica de alta graduacin de 1 oz (44 ml). Estilo de vida No consuma ningn producto que contenga nicotina o tabaco. Estos productos incluyen cigarrillos, tabaco para Theatre manager y aparatos de vapeo, como los cigarrillos electrnicos. Si necesita ayuda para dejar de consumir estos productos, consulte al mdico. No consuma drogas. No comparta agujas. Solicite ayuda a su mdico si necesita apoyo o informacin para abandonar las drogas. Indicaciones generales Realcese los estudios de rutina de [address], dentales y de Wellsite geologist. Mantngase al da con las vacunas. Infrmele a su mdico si: Se siente deprimida con frecuencia. Alguna vez ha sido vctima de maltrato o no se siente seguro en su casa. Resumen Adoptar un estilo de vida saludable y recibir atencin preventiva son importantes para promover la salud y Counsellor. Siga las instrucciones del mdico acerca de una dieta saludable, el ejercicio y la realizacin de pruebas o exmenes para Hotel manager. Siga las instrucciones del mdico con respecto al control del colesterol y la presin arterial. Esta informacin no tiene Theme park manager el consejo del mdico. Asegrese de hacerle al mdico cualquier pregunta que tenga. Document Revised: 07/27/2020 Document Reviewed: 07/27/2020 Elsevier Patient  Education  2024 [employer].

## 2023-07-22 NOTE — Progress Notes (Signed)
 HPI: [name] is a 53 y.o. female with non significant PMHx here today for her routine physical.  Last CPE: 12/21/2019  No new problem since her last visit, 01/2022.  Exercise: Walking 45-60 minutes daily; has not been as consistent with this recently.  Diet: Vegetables daily  Sleep: 8 hours Smoking: Never Alcohol consumption: Rarely Dental / Vision: UTD with routine care.   Immunization History  Administered Date(s) Administered   Influenza,inj,Quad PF,6+ Mos 12/21/2019, 01/08/2022   PFIZER(Purple Top)SARS-COV-2 Vaccination 05/24/2019, 06/14/2019, 02/02/2020   Tdap 12/21/2019   Health Maintenance  Topic Date Due   Cervical Cancer Screening (Pap smear)  09/19/2023   COVID-19 Vaccine (4 - 2024-25 season) 08/07/2023 (Originally 11/03/2022)   Zoster Vaccines- Shingrix (1 of 2) 10/22/2023 (Originally 06/09/1991)   Colonoscopy  07/21/2024 (Originally 06/08/2017)   HIV Screening  12/20/2024 (Originally 06[birth year]09/1987)   INFLUENZA VACCINE  10/03/2023   MAMMOGRAM  01/07/2024   DTaP/Tdap/Td (2 - Td or Tdap) 12/20/2029   Hepatitis C Screening  Completed   HPV VACCINES  Aged Out   Meningococcal B Vaccine  Aged Out   Palpitations: Pt complains of sporadic palpitations at nighttime, ongoing  for about 2-3 years.  No associated symptoms, not sure about duration, usually goes back to sleep. She endorses sleep disturbances, stating that she usually wakes up around 3-4 AM and feels episodes at this time. She is not sure if palpitations wake her up from sleep. She experiences and unsettling or anxious feeling during these episodes. No chest pain or palpitations while exercising.   She reports hot flashes in the past, but is not currently experiencing this.  She is uncertain whether these palpitations may be associated with potential  peri-menopause.  Still having regular menses. Of note, pt is established with gynecology.   Colon cancer screening: Pt last had a colonoscopy at 53 y/o in  [place].  Her mother has a hx of colon cancer; required surgery.  At this time, she does not want to schedule her colonoscopy, but notes she will plan to schedule it in the future.   Review of Systems  Constitutional:  Negative for activity change, appetite change and fever.  HENT:  Negative for hearing loss, mouth sores, sore throat and trouble swallowing.   Eyes:  Negative for redness and visual disturbance.  Respiratory:  Negative for cough, shortness of breath and wheezing.   Cardiovascular:  Positive for palpitations. Negative for chest pain and leg swelling.  Gastrointestinal:  Negative for abdominal pain, nausea and vomiting.       No changes in bowel habits.  Endocrine: Negative for cold intolerance, heat intolerance, polydipsia, polyphagia and polyuria.  Genitourinary:  Negative for decreased urine volume, dysuria and hematuria.  Musculoskeletal:  Positive for neck pain (chronic). Negative for gait problem and myalgias.  Skin:  Negative for color change and rash.  Allergic/Immunologic: Negative for environmental allergies.  Neurological:  Negative for seizures, syncope, weakness and headaches.  Hematological:  Negative for adenopathy. Does not bruise/bleed easily.  Psychiatric/Behavioral:  Positive for sleep disturbance. Negative for confusion and hallucinations.   All other systems reviewed and are negative.  No current outpatient medications on file prior to visit.   No current facility-administered medications on file prior to visit.   Past Medical History:  Diagnosis Date   Hepatitis    Past Surgical History:  Procedure Laterality Date   BREAST SURGERY Left    pt states ex bx 2002-2003-benign   ceasarean     CESAREAN SECTION  No Known Allergies  Family History  Problem Relation Age of Onset   Breast cancer Neg Hx    Social History   Socioeconomic History   Marital status: Married    Spouse name: Not on file   Number of children: Not on file   Years  of education: Not on file   Highest education level: Not on file  Occupational History   Not on file  Tobacco Use   Smoking status: Never   Smokeless tobacco: Never  Vaping Use   Vaping status: Never Used  Substance and Sexual Activity   Alcohol use: No   Drug use: No   Sexual activity: Yes    Partners: Male    Birth control/protection: Condom  Other Topics Concern   Not on file  Social History Narrative   Not on file   Social Drivers of Health   Financial Resource Strain: Not on file  Food Insecurity: Not on file  Transportation Needs: Not on file  Physical Activity: Not on file  Stress: Not on file  Social Connections: Not on file   Vitals:   07/22/23 1256  BP: 120/70  Pulse: 91  Resp: 12  SpO2: 99%   Body mass index is 27.78 kg/m.  Wt Readings from Last 3 Encounters:  07/22/23 172 lb 2 oz (78.1 kg)  01/06/23 166 lb 12.8 oz (75.7 kg)  11/18/22 170 lb (77.1 kg)   Physical Exam Vitals and nursing note reviewed.  Constitutional:      General: She is not in acute distress.    Appearance: She is well-developed.  HENT:     Head: Normocephalic and atraumatic.     Right Ear: Tympanic membrane, ear canal and external ear normal.     Left Ear: Tympanic membrane, ear canal and external ear normal.     Mouth/Throat:     Mouth: Mucous membranes are moist.     Pharynx: Oropharynx is clear. Uvula midline.  Eyes:     Extraocular Movements: Extraocular movements intact.     Conjunctiva/sclera: Conjunctivae normal.     Pupils: Pupils are equal, round, and reactive to light.  Neck:     Thyroid : No thyromegaly.     Trachea: No tracheal deviation.  Cardiovascular:     Rate and Rhythm: Normal rate and regular rhythm.     Pulses:          Dorsalis pedis pulses are 2+ on the right side and 2+ on the left side.     Heart sounds: No murmur heard. Pulmonary:     Effort: Pulmonary effort is normal. No respiratory distress.     Breath sounds: Normal breath sounds.   Abdominal:     Palpations: Abdomen is soft. There is no hepatomegaly or mass.     Tenderness: There is no abdominal tenderness.  Genitourinary:    Comments: Deferred to gyn. Musculoskeletal:     Right lower leg: No edema.     Left lower leg: No edema.     Comments: No major deformity or signs of synovitis appreciated.  Lymphadenopathy:     Cervical: No cervical adenopathy.     Upper Body:     Right upper body: No supraclavicular adenopathy.     Left upper body: No supraclavicular adenopathy.  Skin:    General: Skin is warm.     Findings: No erythema or rash.  Neurological:     General: No focal deficit present.     Mental Status: She is alert and  oriented to person, place, and time.     Cranial Nerves: No cranial nerve deficit.     Coordination: Coordination normal.     Gait: Gait normal.     Deep Tendon Reflexes:     Reflex Scores:      Bicep reflexes are 2+ on the right side and 2+ on the left side.      Patellar reflexes are 2+ on the right side and 2+ on the left side. Psychiatric:        Mood and Affect: Mood and affect normal.   ASSESSMENT AND PLAN: Ms. Cheryl Nichols was here today annual physical examination.  Orders Placed This Encounter  Procedures   CBC   TSH   Comprehensive metabolic panel with GFR   Lipid panel   LONG TERM MONITOR (3-14 DAYS)   Lab Results  Component Value Date   WBC 7.6 07/22/2023   HGB 12.7 07/22/2023   HCT 38.4 07/22/2023   MCV 87.0 07/22/2023   PLT 329.0 07/22/2023   Lab Results  Component Value Date   TSH 0.87 07/22/2023   Lab Results  Component Value Date   ALT 17 07/22/2023   AST 19 07/22/2023   ALKPHOS 99 07/22/2023   BILITOT 0.4 07/22/2023   Lab Results  Component Value Date   NA 137 07/22/2023   CL 102 07/22/2023   K 3.7 07/22/2023   CO2 24 07/22/2023   BUN 13 07/22/2023   CREATININE 0.57 07/22/2023   GFR 105.44 07/22/2023   CALCIUM 9.3 07/22/2023   ALBUMIN 4.5 07/22/2023   GLUCOSE 81 07/22/2023   Lab  Results  Component Value Date   CHOL 164 07/22/2023   HDL 76.70 07/22/2023   LDLCALC 75 07/22/2023   TRIG 62.0 07/22/2023   CHOLHDL 2 07/22/2023   Routine general medical examination at a health care facility Assessment & Plan: We discussed the importance of regular physical activity and healthy diet for prevention of chronic illness and/or complications. Preventive guidelines reviewed. Vaccination: She prefers to hold on Shingrix and Prevnar 20 for now. Declined colon cancer screening/colonoscopy, she will let me know if she decides to do it. Continue her female preventive care with gynecologist. Next CPE in a year.   Palpitations Assessment & Plan: This is a problem that she has had for 2 to 3 years. Reporting episodes 2-3 times per week. She was referred to cardiologist in 11/2020, did not arrange appointment. Problem seems to be stable. She agrees with having heart monitor arranged. Instructed about warning signs. Further recommendation will be given according to lab results.  Orders: -     LONG TERM MONITOR (3-14 DAYS); Future -     CBC; Future -     TSH; Future  Screening for lipoid disorders -     Lipid panel; Future  Screening for endocrine, metabolic and immunity disorder -     Comprehensive metabolic panel with GFR; Future   Return in 1 year (on 07/21/2024) for CPE, chronic problems.  I, Bernita Bristle, acting as a scribe for Consandra Laske [place], MD., have documented all relevant documentation on the behalf of Cheryl Cullom [place], MD, as directed by   while in the presence of Kalise Fickett [place], MD.  I, Deisi Salonga [place], MD, have reviewed all documentation for this visit. The documentation on 07/22/23 for the exam, diagnosis, procedures, and orders are all accurate and complete.   Vung Kush G. [place], MD  [facility]. [place] office.

## 2023-07-22 NOTE — Assessment & Plan Note (Addendum)
 This is a problem that she has had for 2 to 3 years. Reporting episodes 2-3 times per week. She was referred to cardiologist in 11/2020, did not arrange appointment. Problem seems to be stable. She agrees with having heart monitor arranged. Instructed about warning signs. Further recommendation will be given according to lab results.

## 2023-07-23 ENCOUNTER — Ambulatory Visit: Payer: Self-pay | Admitting: Family Medicine

## 2023-07-23 LAB — LIPID PANEL
Cholesterol: 164 mg/dL (ref 0–200)
HDL: 76.7 mg/dL (ref >39.00)
LDL Cholesterol: 75 mg/dL (ref 0–99)
NonHDL: 87.6
Total CHOL/HDL Ratio: 2
Triglycerides: 62 mg/dL (ref 0.0–149.0)
VLDL: 12.4 mg/dL (ref 0.0–40.0)

## 2023-07-23 LAB — COMPREHENSIVE METABOLIC PANEL WITH GFR
ALT: 17 U/L (ref 0–35)
AST: 19 U/L (ref 0–37)
Albumin: 4.5 g/dL (ref 3.5–5.2)
Alkaline Phosphatase: 99 U/L (ref 39–117)
BUN: 13 mg/dL (ref 6–23)
CO2: 24 meq/L (ref 19–32)
Calcium: 9.3 mg/dL (ref 8.4–10.5)
Chloride: 102 meq/L (ref 96–112)
Creatinine, Ser: 0.57 mg/dL (ref 0.40–1.20)
GFR: 105.44 mL/min (ref >60.00)
Glucose, Bld: 81 mg/dL (ref 70–99)
Potassium: 3.7 meq/L (ref 3.5–5.1)
Sodium: 137 meq/L (ref 135–145)
Total Bilirubin: 0.4 mg/dL (ref 0.2–1.2)
Total Protein: 7.7 g/dL (ref 6.0–8.3)

## 2023-08-14 DIAGNOSIS — R002 Palpitations: Secondary | ICD-10-CM | POA: Diagnosis not present

## 2023-12-23 ENCOUNTER — Encounter: Payer: Self-pay | Admitting: Obstetrics and Gynecology

## 2023-12-24 ENCOUNTER — Other Ambulatory Visit: Payer: Self-pay | Admitting: *Deleted

## 2023-12-24 DIAGNOSIS — Z0000 Encounter for general adult medical examination without abnormal findings: Secondary | ICD-10-CM

## 2024-01-16 ENCOUNTER — Encounter

## 2024-01-16 DIAGNOSIS — Z1231 Encounter for screening mammogram for malignant neoplasm of breast: Secondary | ICD-10-CM

## 2024-02-04 ENCOUNTER — Ambulatory Visit: Admitting: Physician Assistant

## 2024-04-19 ENCOUNTER — Ambulatory Visit: Payer: Self-pay | Admitting: Obstetrics and Gynecology
